# Patient Record
Sex: Male | Born: 1960 | Race: White | Hispanic: No | Marital: Married | Smoking: Former smoker
Health system: Southern US, Community
[De-identification: ages and names within clinical notes are randomized; demographics above are authoritative.]

## PROBLEM LIST (undated history)

## (undated) DIAGNOSIS — I10 Essential (primary) hypertension: Secondary | ICD-10-CM

## (undated) DIAGNOSIS — F419 Anxiety disorder, unspecified: Secondary | ICD-10-CM

## (undated) DIAGNOSIS — J189 Pneumonia, unspecified organism: Secondary | ICD-10-CM

## (undated) DIAGNOSIS — M1990 Unspecified osteoarthritis, unspecified site: Secondary | ICD-10-CM

## (undated) DIAGNOSIS — F329 Major depressive disorder, single episode, unspecified: Secondary | ICD-10-CM

## (undated) DIAGNOSIS — F32A Depression, unspecified: Secondary | ICD-10-CM

## (undated) HISTORY — PX: FOOT SURGERY: SHX648

## (undated) HISTORY — PX: TONSILLECTOMY: SUR1361

## (undated) HISTORY — PX: FINGER SURGERY: SHX640

---

## 2016-07-30 HISTORY — PX: ANTERIOR FUSION CERVICAL SPINE: SUR626

## 2017-03-07 ENCOUNTER — Other Ambulatory Visit (HOSPITAL_COMMUNITY): Payer: Self-pay | Admitting: Neurological Surgery

## 2017-03-07 DIAGNOSIS — M4727 Other spondylosis with radiculopathy, lumbosacral region: Secondary | ICD-10-CM

## 2017-03-14 ENCOUNTER — Ambulatory Visit (HOSPITAL_COMMUNITY)
Admission: RE | Admit: 2017-03-14 | Discharge: 2017-03-14 | Disposition: A | Discharge status: Home / Self Care | Payer: BLUE CROSS/BLUE SHIELD | Source: Ambulatory Visit | Attending: Neurological Surgery | Admitting: Neurological Surgery

## 2017-03-14 ENCOUNTER — Encounter (HOSPITAL_COMMUNITY): Payer: Self-pay

## 2017-03-14 ENCOUNTER — Ambulatory Visit (HOSPITAL_COMMUNITY): Payer: BLUE CROSS/BLUE SHIELD

## 2017-03-14 DIAGNOSIS — I2510 Atherosclerotic heart disease of native coronary artery without angina pectoris: Secondary | ICD-10-CM | POA: Insufficient documentation

## 2017-03-14 DIAGNOSIS — M48061 Spinal stenosis, lumbar region without neurogenic claudication: Secondary | ICD-10-CM | POA: Diagnosis not present

## 2017-03-14 DIAGNOSIS — M4727 Other spondylosis with radiculopathy, lumbosacral region: Secondary | ICD-10-CM | POA: Diagnosis present

## 2017-03-14 DIAGNOSIS — M5134 Other intervertebral disc degeneration, thoracic region: Secondary | ICD-10-CM | POA: Diagnosis not present

## 2017-03-21 ENCOUNTER — Other Ambulatory Visit: Payer: Self-pay | Admitting: Neurological Surgery

## 2017-04-24 ENCOUNTER — Encounter (HOSPITAL_COMMUNITY)
Admission: RE | Admit: 2017-04-24 | Discharge: 2017-04-24 | Disposition: A | Discharge status: Home / Self Care | Payer: BLUE CROSS/BLUE SHIELD | Source: Ambulatory Visit | Attending: Neurological Surgery | Admitting: Neurological Surgery

## 2017-04-24 ENCOUNTER — Encounter (HOSPITAL_COMMUNITY): Payer: Self-pay

## 2017-04-24 DIAGNOSIS — I10 Essential (primary) hypertension: Secondary | ICD-10-CM | POA: Diagnosis not present

## 2017-04-24 DIAGNOSIS — Z0183 Encounter for blood typing: Secondary | ICD-10-CM | POA: Diagnosis not present

## 2017-04-24 DIAGNOSIS — Z79899 Other long term (current) drug therapy: Secondary | ICD-10-CM | POA: Insufficient documentation

## 2017-04-24 DIAGNOSIS — Z01818 Encounter for other preprocedural examination: Secondary | ICD-10-CM | POA: Insufficient documentation

## 2017-04-24 DIAGNOSIS — Z87891 Personal history of nicotine dependence: Secondary | ICD-10-CM | POA: Insufficient documentation

## 2017-04-24 DIAGNOSIS — Z01812 Encounter for preprocedural laboratory examination: Secondary | ICD-10-CM | POA: Insufficient documentation

## 2017-04-24 HISTORY — DX: Unspecified osteoarthritis, unspecified site: M19.90

## 2017-04-24 HISTORY — DX: Major depressive disorder, single episode, unspecified: F32.9

## 2017-04-24 HISTORY — DX: Anxiety disorder, unspecified: F41.9

## 2017-04-24 HISTORY — DX: Depression, unspecified: F32.A

## 2017-04-24 HISTORY — DX: Essential (primary) hypertension: I10

## 2017-04-24 HISTORY — DX: Pneumonia, unspecified organism: J18.9

## 2017-04-24 LAB — CBC
HCT: 44.9 % (ref 39.0–52.0)
Hemoglobin: 15.9 g/dL (ref 13.0–17.0)
MCH: 31.7 pg (ref 26.0–34.0)
MCHC: 35.4 g/dL (ref 30.0–36.0)
MCV: 89.4 fL (ref 78.0–100.0)
PLATELETS: 224 10*3/uL (ref 150–400)
RBC: 5.02 MIL/uL (ref 4.22–5.81)
RDW: 12.5 % (ref 11.5–15.5)
WBC: 6 10*3/uL (ref 4.0–10.5)

## 2017-04-24 LAB — BASIC METABOLIC PANEL
ANION GAP: 9 (ref 5–15)
BUN: 8 mg/dL (ref 6–20)
CALCIUM: 9.6 mg/dL (ref 8.9–10.3)
CO2: 25 mmol/L (ref 22–32)
Chloride: 96 mmol/L — ABNORMAL LOW (ref 101–111)
Creatinine, Ser: 0.89 mg/dL (ref 0.61–1.24)
GFR calc Af Amer: >60 mL/min (ref >60)
Glucose, Bld: 94 mg/dL (ref 65–99)
POTASSIUM: 4.4 mmol/L (ref 3.5–5.1)
SODIUM: 130 mmol/L — AB (ref 135–145)

## 2017-04-24 LAB — TYPE AND SCREEN
ABO/RH(D): AB POS
ANTIBODY SCREEN: NEGATIVE

## 2017-04-24 LAB — SURGICAL PCR SCREEN
MRSA, PCR: NEGATIVE
Staphylococcus aureus: NEGATIVE

## 2017-04-24 LAB — ABO/RH: ABO/RH(D): AB POS

## 2017-04-24 NOTE — Pre-Procedure Instructions (Signed)
Jayten Gabbard  04/24/2017      [id number] [place], [place] - [address] [place] [address] Phone: [phone] Fax: [phone]    Your procedure is scheduled on Tuesday, 04/30/2017.  Report to [facility] Admitting at 0530 A.M.  Call this number if you have problems the morning of surgery:  [phone]   Remember:  Do not eat food or drink liquids after midnight.   Continue all other medications as directed by your physician except follow these medication instructions before surgery   Take these medicines the morning of surgery with A SIP OF WATER:  Amlodipine (Norvasc)  Methocarbamol (Robaxin)  Propanolol (Inderal)   7 days prior to surgery STOP taking any Aspirin, Aleve, Naproxen, Ibuprofen, Motrin, Advil, Goody's, BC's, all herbal medications, fish oil, and all vitamins    Do not wear jewelry, make-up or nail polish.  Do not wear lotions, powders, or perfumes, or deoderant.  Do not shave 48 hours prior to surgery.  Men may shave face and neck.  Do not bring valuables to the hospital.  [facility] is not responsible for any belongings or valuables.  Contacts, eyeglasses, dentures or bridgework may not be worn into surgery.  Leave your suitcase in the car.  After surgery it may be brought to your room.  For patients admitted to the hospital, discharge time will be determined by your treatment team.  Patients discharged the day of surgery will not be allowed to drive home.   Name and phone number of your driver:    Special instructions:   [facility]- Preparing For Surgery  Before surgery, you can play an important role. Because skin is not sterile, your skin needs to be as free of germs as possible. You can reduce the number of germs on your skin by washing with CHG (chlorahexidine gluconate) Soap before surgery.  CHG is an antiseptic cleaner which kills germs and bonds with the skin to continue killing germs even  after washing.  Please do not use if you have an allergy to CHG or antibacterial soaps. If your skin becomes reddened/irritated stop using the CHG.  Do not shave (including legs and underarms) for at least 48 hours prior to first CHG shower. It is OK to shave your face.  Please follow these instructions carefully.   1. Shower the NIGHT BEFORE SURGERY and the MORNING OF SURGERY with CHG.   2. If you chose to wash your hair, wash your hair first as usual with your normal shampoo.  3. After you shampoo, rinse your hair and body thoroughly to remove the shampoo.  4. Use CHG as you would any other liquid soap. You can apply CHG directly to the skin and wash gently with a scrungie or a clean washcloth.   5. Apply the CHG Soap to your body ONLY FROM THE NECK DOWN.  Do not use on open wounds or open sores. Avoid contact with your eyes, ears, mouth and genitals (private parts). Wash genitals (private parts) with your normal soap.  6. Wash thoroughly, paying special attention to the area where your surgery will be performed.  7. Thoroughly rinse your body with warm water from the neck down.  8. DO NOT shower/wash with your normal soap after using and rinsing off the CHG Soap.  9. Pat yourself dry with a CLEAN TOWEL.   10. Wear CLEAN PAJAMAS   11. Place CLEAN SHEETS on your bed the night of  your first shower and DO NOT SLEEP WITH PETS.    Day of Surgery: Shower as stated above. Do not apply any deodorants/lotions.  Please wear clean clothes to the hospital/surgery center.      Please read over the following fact sheets that you were given. Pain Booklet, Coughing and Deep Breathing, MRSA Information and Surgical Site Infection Prevention

## 2017-04-24 NOTE — Progress Notes (Signed)
PCP - Dr. Daryel Gerald Cardiologist - not followed by a cardiologist at this time but was seeing one, unsure who it was, for about 6 months, Patient states he had an echo and ekg done at that time  Chest x-ray - n/a EKG - 04/24/2017 Stress Test - patient denies ECHO - patient states it was late 2016-early 2017 Cardiac Cath - patient denies  Sleep Study - patient denies   Patient denies shortness of breath, fever, cough and chest pain at PAT appointment   Patient verbalized understanding of instructions that were given to them at the PAT appointment. Patient was also instructed that they will need to review over the PAT instructions again at home before surgery.  Requested Echo and last EKG tracing from [facility] in [place], [place]

## 2017-04-25 NOTE — Progress Notes (Signed)
Anesthesia Chart Review:  Pt is a 56 year old male scheduled for left L2-3 transpoas lumbar interbody fusion with right L3-4, L4-5 transpoas lumbar interbody fusion on 04/30/2017 and L2-S1 laminectomy for decompression with L2-pelvis fixation, fusion, Mazor on 05/01/2017 with Cherrie Distance, MD.   - PCP is Daryel Gerald, NP in [place], [place] - Saw cardiologist Nuala Alpha, MD in [place], [place] in 2017 for palpitations. Holter monitor, echo ordered.  PRN f/u recommended.   PMH includes: HTN. Former smoker. BMI 28  Medications include: amlodipine, lisinopril, propranolol  BP 130/79   Pulse 70   Temp 36.8 C   Resp 20   Ht 6' (1.829 m)   Wt 204 lb 4.8 oz (92.7 kg)   SpO2 100%   BMI 27.71 kg/m   Preoperative labs reviewed.    EKG 04/24/17: NSR  Echo 08/19/15 ([facility]): 1.LV cavity size normal. Wall thickness normal. Systolic function normal. Estimated EF 60-65%. Wall motion normal there were no regional wall motion abnormalities. 2. RV systolic function normal. 3. Pulmonary artery systolic pressure estimated to be 29 mmHg.  Holter monitor 08/18/15 ([facility]): 1. Sinus rhythm. Rare PACs and PVCs. 2. No prolonged atrial or ventricular arrhythmias. No sinus pauses.  If no changes, I anticipate pt can proceed with surgery as scheduled.   Rica Mast, FNP-BC [facility] Short Stay Surgical Center/Anesthesiology Phone: [phone] 04/25/2017 2:27 PM

## 2017-04-29 MED ORDER — CEFAZOLIN SODIUM-DEXTROSE 2-4 GM/100ML-% IV SOLN
2.0000 g | INTRAVENOUS | Status: DC
  Filled 2017-04-29: qty 100

## 2017-04-29 MED ORDER — CEFAZOLIN SODIUM-DEXTROSE 2-4 GM/100ML-% IV SOLN
2.0000 g | INTRAVENOUS | Status: AC
  Administered 2017-04-30: 2 g via INTRAVENOUS

## 2017-04-30 ENCOUNTER — Inpatient Hospital Stay (HOSPITAL_COMMUNITY)
Admission: RE | Admit: 2017-04-30 | Discharge: 2017-05-04 | DRG: 455 | Disposition: A | Discharge status: Home / Self Care | Payer: BLUE CROSS/BLUE SHIELD | Source: Ambulatory Visit | Attending: Neurological Surgery | Admitting: Neurological Surgery

## 2017-04-30 ENCOUNTER — Inpatient Hospital Stay (HOSPITAL_COMMUNITY): Payer: BLUE CROSS/BLUE SHIELD | Admitting: Certified Registered Nurse Anesthetist

## 2017-04-30 ENCOUNTER — Inpatient Hospital Stay (HOSPITAL_COMMUNITY): Payer: BLUE CROSS/BLUE SHIELD | Admitting: Emergency Medicine

## 2017-04-30 ENCOUNTER — Inpatient Hospital Stay (HOSPITAL_COMMUNITY)
Admission: RE | Disposition: A | Discharge status: Home / Self Care | Payer: Self-pay | Source: Ambulatory Visit | Attending: Neurological Surgery

## 2017-04-30 ENCOUNTER — Inpatient Hospital Stay (HOSPITAL_COMMUNITY): Payer: BLUE CROSS/BLUE SHIELD

## 2017-04-30 ENCOUNTER — Encounter (HOSPITAL_COMMUNITY): Payer: Self-pay

## 2017-04-30 DIAGNOSIS — Z419 Encounter for procedure for purposes other than remedying health state, unspecified: Secondary | ICD-10-CM

## 2017-04-30 DIAGNOSIS — Z981 Arthrodesis status: Secondary | ICD-10-CM | POA: Diagnosis not present

## 2017-04-30 DIAGNOSIS — I10 Essential (primary) hypertension: Secondary | ICD-10-CM | POA: Diagnosis present

## 2017-04-30 DIAGNOSIS — Z720 Tobacco use: Secondary | ICD-10-CM

## 2017-04-30 DIAGNOSIS — Z23 Encounter for immunization: Secondary | ICD-10-CM

## 2017-04-30 DIAGNOSIS — Z91018 Allergy to other foods: Secondary | ICD-10-CM | POA: Diagnosis not present

## 2017-04-30 DIAGNOSIS — Z79899 Other long term (current) drug therapy: Secondary | ICD-10-CM | POA: Diagnosis not present

## 2017-04-30 DIAGNOSIS — M4180 Other forms of scoliosis, site unspecified: Secondary | ICD-10-CM | POA: Diagnosis present

## 2017-04-30 DIAGNOSIS — M4727 Other spondylosis with radiculopathy, lumbosacral region: Secondary | ICD-10-CM | POA: Diagnosis present

## 2017-04-30 DIAGNOSIS — M5416 Radiculopathy, lumbar region: Secondary | ICD-10-CM | POA: Diagnosis present

## 2017-04-30 HISTORY — PX: ANTERIOR LAT LUMBAR FUSION: SHX1168

## 2017-04-30 SURGERY — ANTERIOR LATERAL LUMBAR FUSION 3 LEVELS
Anesthesia: General | Site: Spine Lumbar

## 2017-04-30 MED ORDER — FENTANYL CITRATE (PF) 100 MCG/2ML IJ SOLN
INTRAMUSCULAR | Status: DC | PRN
  Administered 2017-04-30: 50 ug via INTRAVENOUS
  Administered 2017-04-30 (×2): 100 ug via INTRAVENOUS
  Administered 2017-04-30: 50 ug via INTRAVENOUS

## 2017-04-30 MED ORDER — ACETAMINOPHEN 325 MG PO TABS
650.0000 mg | ORAL_TABLET | ORAL | Status: DC | PRN
  Administered 2017-05-02: 650 mg via ORAL
  Filled 2017-04-30 (×2): qty 2

## 2017-04-30 MED ORDER — THROMBIN 5000 UNITS EX SOLR
CUTANEOUS | Status: DC | PRN
  Administered 2017-04-30: 5 mL via TOPICAL

## 2017-04-30 MED ORDER — BACITRACIN 50000 UNITS IM SOLR
INTRAMUSCULAR | Status: DC | PRN
  Administered 2017-04-30: 500 mL

## 2017-04-30 MED ORDER — BISACODYL 10 MG RE SUPP: 10.0000 mg | Freq: Every day | RECTAL | Status: DC | PRN

## 2017-04-30 MED ORDER — LACTATED RINGERS IV SOLN
INTRAVENOUS | Status: DC | PRN
  Administered 2017-04-30: 07:00:00 via INTRAVENOUS

## 2017-04-30 MED ORDER — ONDANSETRON HCL 4 MG/2ML IJ SOLN: 4.0000 mg | Freq: Four times a day (QID) | INTRAMUSCULAR | Status: DC | PRN

## 2017-04-30 MED ORDER — HYDROMORPHONE HCL 1 MG/ML IJ SOLN
INTRAMUSCULAR | Status: AC
  Administered 2017-04-30: 0.5 mg via INTRAVENOUS
  Filled 2017-04-30: qty 1

## 2017-04-30 MED ORDER — BUPIVACAINE HCL (PF) 0.5 % IJ SOLN
INTRAMUSCULAR | Status: AC
  Filled 2017-04-30: qty 30

## 2017-04-30 MED ORDER — HYDROMORPHONE HCL 1 MG/ML IJ SOLN
1.0000 mg | INTRAMUSCULAR | Status: DC | PRN
  Administered 2017-04-30 (×2): 1 mg via INTRAVENOUS
  Filled 2017-04-30 (×2): qty 1

## 2017-04-30 MED ORDER — ACETAMINOPHEN 325 MG PO TABS: 325.0000 mg | ORAL_TABLET | ORAL | Status: DC | PRN

## 2017-04-30 MED ORDER — ONDANSETRON HCL 4 MG PO TABS: 4.0000 mg | ORAL_TABLET | Freq: Four times a day (QID) | ORAL | Status: DC | PRN

## 2017-04-30 MED ORDER — PHENYLEPHRINE HCL 10 MG/ML IJ SOLN
INTRAVENOUS | Status: DC | PRN
  Administered 2017-04-30: 25 ug/min via INTRAVENOUS

## 2017-04-30 MED ORDER — FLEET ENEMA 7-19 GM/118ML RE ENEM: 1.0000 | ENEMA | Freq: Once | RECTAL | Status: DC | PRN

## 2017-04-30 MED ORDER — SODIUM CHLORIDE 0.9% FLUSH
3.0000 mL | Freq: Two times a day (BID) | INTRAVENOUS | Status: DC
  Administered 2017-04-30 – 2017-05-03 (×7): 3 mL via INTRAVENOUS

## 2017-04-30 MED ORDER — ONDANSETRON HCL 4 MG/2ML IJ SOLN
INTRAMUSCULAR | Status: AC
  Filled 2017-04-30: qty 2

## 2017-04-30 MED ORDER — PHENOL 1.4 % MT LIQD: 1.0000 | OROMUCOSAL | Status: DC | PRN

## 2017-04-30 MED ORDER — GLYCOPYRROLATE 0.2 MG/ML IJ SOLN
INTRAMUSCULAR | Status: DC | PRN
  Administered 2017-04-30: 0.2 mg via INTRAVENOUS

## 2017-04-30 MED ORDER — OXYCODONE HCL 5 MG/5ML PO SOLN: 5.0000 mg | Freq: Once | ORAL | Status: DC | PRN

## 2017-04-30 MED ORDER — CHLORHEXIDINE GLUCONATE CLOTH 2 % EX PADS: 6.0000 | MEDICATED_PAD | Freq: Once | CUTANEOUS | Status: DC

## 2017-04-30 MED ORDER — ZOLPIDEM TARTRATE 5 MG PO TABS: 5.0000 mg | ORAL_TABLET | Freq: Every evening | ORAL | Status: DC | PRN

## 2017-04-30 MED ORDER — MIDAZOLAM HCL 5 MG/5ML IJ SOLN
INTRAMUSCULAR | Status: DC | PRN
  Administered 2017-04-30: 2 mg via INTRAVENOUS

## 2017-04-30 MED ORDER — 0.9 % SODIUM CHLORIDE (POUR BTL) OPTIME
TOPICAL | Status: DC | PRN
  Administered 2017-04-30: 1000 mL

## 2017-04-30 MED ORDER — DEXAMETHASONE SODIUM PHOSPHATE 10 MG/ML IJ SOLN
INTRAMUSCULAR | Status: AC
  Filled 2017-04-30: qty 1

## 2017-04-30 MED ORDER — BUPIVACAINE-EPINEPHRINE 0.5% -1:200000 IJ SOLN
INTRAMUSCULAR | Status: DC | PRN
  Administered 2017-04-30: 5 mL

## 2017-04-30 MED ORDER — METHOCARBAMOL 1000 MG/10ML IJ SOLN
500.0000 mg | Freq: Four times a day (QID) | INTRAMUSCULAR | Status: DC | PRN
  Filled 2017-04-30: qty 5

## 2017-04-30 MED ORDER — DEXAMETHASONE SODIUM PHOSPHATE 10 MG/ML IJ SOLN
INTRAMUSCULAR | Status: DC | PRN
  Administered 2017-04-30: 10 mg via INTRAVENOUS

## 2017-04-30 MED ORDER — LIDOCAINE 2% (20 MG/ML) 5 ML SYRINGE
INTRAMUSCULAR | Status: DC | PRN
  Administered 2017-04-30: 80 mg via INTRAVENOUS

## 2017-04-30 MED ORDER — SUCCINYLCHOLINE CHLORIDE 20 MG/ML IJ SOLN
INTRAMUSCULAR | Status: DC | PRN
  Administered 2017-04-30: 80 mg via INTRAVENOUS

## 2017-04-30 MED ORDER — LIDOCAINE-EPINEPHRINE 2 %-1:100000 IJ SOLN
INTRAMUSCULAR | Status: AC
  Filled 2017-04-30: qty 1

## 2017-04-30 MED ORDER — OXYCODONE HCL 5 MG PO TABS
5.0000 mg | ORAL_TABLET | ORAL | Status: DC | PRN
  Administered 2017-04-30 – 2017-05-04 (×13): 10 mg via ORAL
  Filled 2017-04-30 (×12): qty 2

## 2017-04-30 MED ORDER — SODIUM CHLORIDE 0.9 % IV SOLN
INTRAVENOUS | Status: DC
  Administered 2017-05-01: 23:00:00 via INTRAVENOUS

## 2017-04-30 MED ORDER — FENTANYL CITRATE (PF) 250 MCG/5ML IJ SOLN
INTRAMUSCULAR | Status: AC
  Filled 2017-04-30: qty 5

## 2017-04-30 MED ORDER — METHOCARBAMOL 1000 MG/10ML IJ SOLN
500.0000 mg | INTRAVENOUS | Status: AC
  Administered 2017-04-30: 500 mg via INTRAVENOUS
  Filled 2017-04-30: qty 5

## 2017-04-30 MED ORDER — CEFAZOLIN SODIUM-DEXTROSE 2-4 GM/100ML-% IV SOLN
2.0000 g | Freq: Three times a day (TID) | INTRAVENOUS | Status: AC
  Administered 2017-04-30 (×2): 2 g via INTRAVENOUS
  Filled 2017-04-30 (×2): qty 100

## 2017-04-30 MED ORDER — SODIUM CHLORIDE 0.9% FLUSH: 3.0000 mL | INTRAVENOUS | Status: DC | PRN

## 2017-04-30 MED ORDER — PROPOFOL 500 MG/50ML IV EMUL
INTRAVENOUS | Status: DC | PRN
  Administered 2017-04-30: 75 ug/kg/min via INTRAVENOUS

## 2017-04-30 MED ORDER — BUPIVACAINE LIPOSOME 1.3 % IJ SUSP
20.0000 mL | INTRAMUSCULAR | Status: DC
  Filled 2017-04-30: qty 20

## 2017-04-30 MED ORDER — METHOCARBAMOL 500 MG PO TABS
500.0000 mg | ORAL_TABLET | Freq: Four times a day (QID) | ORAL | Status: DC | PRN
  Administered 2017-04-30: 500 mg via ORAL
  Filled 2017-04-30: qty 1

## 2017-04-30 MED ORDER — LIDOCAINE 2% (20 MG/ML) 5 ML SYRINGE
INTRAMUSCULAR | Status: AC
  Filled 2017-04-30: qty 5

## 2017-04-30 MED ORDER — BUPIVACAINE HCL (PF) 0.5 % IJ SOLN
INTRAMUSCULAR | Status: DC | PRN
  Administered 2017-04-30: 5 mL
  Administered 2017-04-30: 3 mL

## 2017-04-30 MED ORDER — LIDOCAINE-EPINEPHRINE 2 %-1:100000 IJ SOLN
INTRAMUSCULAR | Status: DC | PRN
  Administered 2017-04-30: 5 mL

## 2017-04-30 MED ORDER — SENNA 8.6 MG PO TABS
1.0000 | ORAL_TABLET | Freq: Two times a day (BID) | ORAL | Status: DC
  Administered 2017-04-30 – 2017-05-04 (×7): 8.6 mg via ORAL
  Filled 2017-04-30 (×7): qty 1

## 2017-04-30 MED ORDER — DOCUSATE SODIUM 100 MG PO CAPS
100.0000 mg | ORAL_CAPSULE | Freq: Two times a day (BID) | ORAL | Status: DC
  Administered 2017-04-30 – 2017-05-04 (×7): 100 mg via ORAL
  Filled 2017-04-30 (×7): qty 1

## 2017-04-30 MED ORDER — PROPOFOL 1000 MG/100ML IV EMUL
INTRAVENOUS | Status: AC
  Filled 2017-04-30: qty 100

## 2017-04-30 MED ORDER — ACETAMINOPHEN 650 MG RE SUPP: 650.0000 mg | RECTAL | Status: DC | PRN

## 2017-04-30 MED ORDER — ACETAMINOPHEN 500 MG PO TABS
1000.0000 mg | ORAL_TABLET | Freq: Four times a day (QID) | ORAL | Status: AC
  Administered 2017-04-30 (×2): 1000 mg via ORAL
  Filled 2017-04-30 (×2): qty 2

## 2017-04-30 MED ORDER — OXYCODONE HCL 5 MG PO TABS
ORAL_TABLET | ORAL | Status: AC
  Filled 2017-04-30: qty 2

## 2017-04-30 MED ORDER — HYDROMORPHONE HCL 1 MG/ML IJ SOLN
0.2500 mg | INTRAMUSCULAR | Status: DC | PRN
  Administered 2017-04-30 (×4): 0.5 mg via INTRAVENOUS

## 2017-04-30 MED ORDER — BUPIVACAINE-EPINEPHRINE (PF) 0.5% -1:200000 IJ SOLN
INTRAMUSCULAR | Status: AC
  Filled 2017-04-30: qty 30

## 2017-04-30 MED ORDER — CHLORHEXIDINE GLUCONATE CLOTH 2 % EX PADS
6.0000 | MEDICATED_PAD | Freq: Once | CUTANEOUS | Status: AC
  Administered 2017-04-30: 6 via TOPICAL

## 2017-04-30 MED ORDER — LACTATED RINGERS IV SOLN
INTRAVENOUS | Status: DC | PRN
  Administered 2017-04-30 (×2): via INTRAVENOUS

## 2017-04-30 MED ORDER — MENTHOL 3 MG MT LOZG: 1.0000 | LOZENGE | OROMUCOSAL | Status: DC | PRN

## 2017-04-30 MED ORDER — CHLORHEXIDINE GLUCONATE CLOTH 2 % EX PADS
6.0000 | MEDICATED_PAD | Freq: Once | CUTANEOUS | Status: AC
  Administered 2017-05-01: 6 via TOPICAL

## 2017-04-30 MED ORDER — GABAPENTIN 300 MG PO CAPS
300.0000 mg | ORAL_CAPSULE | Freq: Three times a day (TID) | ORAL | Status: DC
  Administered 2017-04-30 – 2017-05-04 (×10): 300 mg via ORAL
  Filled 2017-04-30 (×9): qty 1

## 2017-04-30 MED ORDER — ONDANSETRON HCL 4 MG/2ML IJ SOLN
INTRAMUSCULAR | Status: DC | PRN
  Administered 2017-04-30: 4 mg via INTRAVENOUS

## 2017-04-30 MED ORDER — ACETAMINOPHEN 160 MG/5ML PO SOLN: 325.0000 mg | ORAL | Status: DC | PRN

## 2017-04-30 MED ORDER — ROCURONIUM BROMIDE 10 MG/ML (PF) SYRINGE
PREFILLED_SYRINGE | INTRAVENOUS | Status: AC
  Filled 2017-04-30: qty 5

## 2017-04-30 MED ORDER — THROMBIN 5000 UNITS EX SOLR
CUTANEOUS | Status: AC
  Filled 2017-04-30: qty 5000

## 2017-04-30 MED ORDER — TRANEXAMIC ACID 1000 MG/10ML IV SOLN
1000.0000 mg | INTRAVENOUS | Status: AC
  Administered 2017-05-01: 1000 mg via INTRAVENOUS
  Filled 2017-04-30 (×2): qty 10

## 2017-04-30 MED ORDER — OXYCODONE HCL 5 MG PO TABS: 5.0000 mg | ORAL_TABLET | Freq: Once | ORAL | Status: DC | PRN

## 2017-04-30 MED ORDER — PROPOFOL 10 MG/ML IV BOLUS
INTRAVENOUS | Status: DC | PRN
  Administered 2017-04-30 (×2): 50 mg via INTRAVENOUS
  Administered 2017-04-30: 40 mg via INTRAVENOUS
  Administered 2017-04-30: 160 mg via INTRAVENOUS

## 2017-04-30 MED ORDER — OXYCODONE HCL ER 10 MG PO T12A
10.0000 mg | EXTENDED_RELEASE_TABLET | Freq: Two times a day (BID) | ORAL | Status: DC
Start: 2017-04-30 — End: 2017-05-01
  Administered 2017-04-30: 10 mg via ORAL
  Filled 2017-04-30: qty 1

## 2017-04-30 MED ORDER — SODIUM CHLORIDE 0.9 % IJ SOLN
INTRAMUSCULAR | Status: AC
  Filled 2017-04-30: qty 20

## 2017-04-30 MED ORDER — MIDAZOLAM HCL 2 MG/2ML IJ SOLN
INTRAMUSCULAR | Status: AC
  Filled 2017-04-30: qty 2

## 2017-04-30 MED ORDER — SENNOSIDES-DOCUSATE SODIUM 8.6-50 MG PO TABS: 1.0000 | ORAL_TABLET | Freq: Every evening | ORAL | Status: DC | PRN

## 2017-04-30 MED ORDER — PROPOFOL 10 MG/ML IV BOLUS
INTRAVENOUS | Status: AC
  Filled 2017-04-30: qty 40

## 2017-04-30 MED FILL — Heparin Sodium (Porcine) Inj 1000 Unit/ML: INTRAMUSCULAR | Qty: 30 | Status: AC

## 2017-04-30 MED FILL — Sodium Chloride IV Soln 0.9%: INTRAVENOUS | Qty: 1000 | Status: AC

## 2017-04-30 SURGICAL SUPPLY — 72 items
BATTALION LLIF ITRADISCAL SHIM (MISCELLANEOUS) ×2
BLADE 10 SAFETY STRL DISP (BLADE) ×2 IMPLANT
BLADE CLIPPER SURG (BLADE) IMPLANT
CABLE BATTALION LLIF LIGHT (MISCELLANEOUS) ×2 IMPLANT
CAGE BATTALION 8X22X55 6D LLIF (Cage) ×4 IMPLANT
CAGE BATTALION 8X22X60 6D LLIF (Cage) ×2 IMPLANT
CHLORAPREP W/TINT 26ML (MISCELLANEOUS) ×4 IMPLANT
COUNTER NEEDLE 20 DBL MAG RED (NEEDLE) ×2 IMPLANT
DECANTER SPIKE VIAL GLASS SM (MISCELLANEOUS) IMPLANT
DERMABOND ADVANCED (GAUZE/BANDAGES/DRESSINGS) ×2
DERMABOND ADVANCED .7 DNX12 (GAUZE/BANDAGES/DRESSINGS) ×2 IMPLANT
DILATOR INSULATED ML04405 (MISCELLANEOUS) IMPLANT
DILATOR INSULATED XL 8X13 (MISCELLANEOUS) ×2 IMPLANT
DILATOR INSULT XL 8X13X18 (DRILL) IMPLANT
DISSECTOR BLUNT TIP ENDO 5MM (MISCELLANEOUS) IMPLANT
DRAPE C-ARM 42X72 X-RAY (DRAPES) ×2 IMPLANT
DRAPE C-ARMOR (DRAPES) ×2 IMPLANT
DRAPE LAPAROTOMY 100X72X124 (DRAPES) ×2 IMPLANT
DRAPE POUCH INSTRU U-SHP 10X18 (DRAPES) ×4 IMPLANT
DRAPE UNIVERSAL PACK (DRAPES) IMPLANT
ELECT COATED BLADE 2.86 ST (ELECTRODE) ×2 IMPLANT
ELECT REM PT RETURN 9FT ADLT (ELECTROSURGICAL) ×4
ELECTRODE REM PT RTRN 9FT ADLT (ELECTROSURGICAL) ×2 IMPLANT
FEE INTRAOP MONITOR IMPULS NCS (MISCELLANEOUS) ×1 IMPLANT
GAUZE SPONGE 4X4 16PLY XRAY LF (GAUZE/BANDAGES/DRESSINGS) IMPLANT
GLOVE BIO SURGEON STRL SZ7 (GLOVE) IMPLANT
GLOVE BIO SURGEON STRL SZ7.5 (GLOVE) ×6 IMPLANT
GLOVE BIOGEL PI IND STRL 7.0 (GLOVE) ×6 IMPLANT
GLOVE BIOGEL PI IND STRL 7.5 (GLOVE) ×3 IMPLANT
GLOVE BIOGEL PI INDICATOR 7.0 (GLOVE) ×6
GLOVE BIOGEL PI INDICATOR 7.5 (GLOVE) ×3
GLOVE SS BIOGEL STRL SZ 7.5 (GLOVE) ×1 IMPLANT
GLOVE SUPERSENSE BIOGEL SZ 7.5 (GLOVE) ×1
GLOVE SURG SS PI 7.0 STRL IVOR (GLOVE) ×4 IMPLANT
GLOVE SURG SS PI 7.5 STRL IVOR (GLOVE) ×6 IMPLANT
GOWN STRL REUS W/ TWL LRG LVL3 (GOWN DISPOSABLE) ×3 IMPLANT
GOWN STRL REUS W/ TWL XL LVL3 (GOWN DISPOSABLE) IMPLANT
GOWN STRL REUS W/TWL 2XL LVL3 (GOWN DISPOSABLE) IMPLANT
GOWN STRL REUS W/TWL LRG LVL3 (GOWN DISPOSABLE) ×3
GOWN STRL REUS W/TWL XL LVL3 (GOWN DISPOSABLE)
GUIDEWIRE 320MM BLUNT TIP (WIRE) ×4 IMPLANT
HEMOSTAT POWDER KIT SURGIFOAM (HEMOSTASIS) ×2 IMPLANT
INTRAOP MONITOR FEE IMPULS NCS (MISCELLANEOUS) ×1
INTRAOP MONITOR FEE IMPULSE (MISCELLANEOUS) ×1
KIT BASIN OR (CUSTOM PROCEDURE TRAY) ×2 IMPLANT
KIT INFUSE SMALL (Orthopedic Implant) ×2 IMPLANT
KIT ROOM TURNOVER OR (KITS) ×2 IMPLANT
LEAD WIRE MULTI STAGE DISP (NEUROSURGERY SUPPLIES) ×4 IMPLANT
NEEDLE HYPO 21X1.5 SAFETY (NEEDLE) ×4 IMPLANT
NEEDLE HYPO 25X1 1.5 SAFETY (NEEDLE) IMPLANT
NS IRRIG 1000ML POUR BTL (IV SOLUTION) ×2 IMPLANT
PACK LAMINECTOMY NEURO (CUSTOM PROCEDURE TRAY) ×2 IMPLANT
PROBE BALL TIP STIM 200MM 2.3M (NEUROSURGERY SUPPLIES) ×4 IMPLANT
PUTTY DBM GRAFTON 5CC (Putty) ×4 IMPLANT
SHIM ITRADISCAL BATTALION LLIF (MISCELLANEOUS) ×1 IMPLANT
SPONGE LAP 4X18 X RAY DECT (DISPOSABLE) IMPLANT
STAPLER VISISTAT 35W (STAPLE) ×2 IMPLANT
SUT STRATAFIX MNCRL+ 3-0 PS-2 (SUTURE) ×2
SUT STRATAFIX MONOCRYL 3-0 (SUTURE) ×2
SUT VIC AB 0 CT1 18XCR BRD8 (SUTURE) ×2 IMPLANT
SUT VIC AB 0 CT1 8-18 (SUTURE) ×2
SUT VIC AB 1 CT1 18XBRD ANBCTR (SUTURE) IMPLANT
SUT VIC AB 1 CT1 8-18 (SUTURE)
SUT VIC AB 2-0 CT1 18 (SUTURE) ×4 IMPLANT
SUT VIC AB 3-0 SH 8-18 (SUTURE) IMPLANT
SUTURE STRATFX MNCRL+ 3-0 PS-2 (SUTURE) ×2 IMPLANT
SYR 30ML LL (SYRINGE) ×2 IMPLANT
TOWEL GREEN STERILE (TOWEL DISPOSABLE) ×2 IMPLANT
TOWEL GREEN STERILE FF (TOWEL DISPOSABLE) ×2 IMPLANT
TRAY FOLEY W/METER SILVER 16FR (SET/KITS/TRAYS/PACK) ×2 IMPLANT
TUBE CONNECTING 20X1/4 (TUBING) ×2 IMPLANT
WATER STERILE IRR 1000ML POUR (IV SOLUTION) ×2 IMPLANT

## 2017-04-30 NOTE — Transfer of Care (Signed)
Immediate Anesthesia Transfer of Care Note  Patient: Seth Colon  Procedure(s) Performed: Stage 1: Left Lumbar two -three Transpsoas lumbar interbody fusion with Right Lumbar three-four, Lumbar four -five Transpsoas lumbar interbody fusion (N/A Spine Lumbar)  Patient Location: PACU  Anesthesia Type:General  Level of Consciousness: oriented, drowsy and patient cooperative  Airway & Oxygen Therapy: Patient Spontanous Breathing and Patient connected to nasal cannula oxygen  Post-op Assessment: Report given to RN and Post -op Vital signs reviewed and stable  Post vital signs: Reviewed and stable  Last Vitals:  Vitals:   04/30/17 1133 04/30/17 1134  BP: 117/76   Pulse: 69   Resp: (!) 21   Temp:  36.5 C  SpO2: 100%     Last Pain:  Vitals:   04/30/17 1134  TempSrc:   PainSc: Asleep         Complications: No apparent anesthesia complications

## 2017-04-30 NOTE — H&P (Signed)
CC:  No chief complaint on file. Back and leg pain  HPI: Seth Colon is a 56 y.o. male with adult degenerative scoliosis and lumbar radiculopathy.  He complains of back and leg pain.  He presents for elective lumbar fusion.  He denies new neurological symptoms.  PMH: Past Medical History:  Diagnosis Date  . Anxiety   . Arthritis   . Depression   . Hypertension   . Pneumonia     PSH: Past Surgical History:  Procedure Laterality Date  . ANTERIOR FUSION CERVICAL SPINE  07/2016  . FINGER SURGERY     "as child"   . FOOT SURGERY     for infection  . TONSILLECTOMY      SH: Social History  Substance Use Topics  . Smoking status: Former Smoker    Quit date: 01/2017  . Smokeless tobacco: Current User    Types: Chew  . Alcohol use 10.8 oz/week    18 Cans of beer per week    MEDS: Prior to Admission medications   Medication Sig Start Date End Date Taking? Authorizing Provider  amLODipine (NORVASC) 10 MG tablet Take 10 mg by mouth daily.   Yes [provider]  lisinopril (PRINIVIL,ZESTRIL) 40 MG tablet Take 40 mg by mouth daily.   Yes [provider]  methocarbamol (ROBAXIN) 750 MG tablet Take 750 mg by mouth 2 (two) times daily.   Yes [provider]  propranolol (INDERAL) 20 MG tablet Take 20 mg by mouth daily.   Yes [provider]    ALLERGY: Allergies  Allergen Reactions  . Other Swelling    MELONS > FACIAL SWELLING    ROS: ROS  NEUROLOGIC EXAM: Awake, alert, oriented Memory and concentration grossly intact Speech fluent, appropriate CN grossly intact Motor exam: Upper Extremities Deltoid Bicep Tricep Grip  Right 5/5 5/5 5/5 5/5  Left 5/5 5/5 5/5 5/5   Lower Extremity IP Quad PF DF EHL  Right 5/5 5/5 5/5 5/5 5/5  Left 5/5 5/5 5/5 5/5 5/5   Sensation grossly intact to LT  IMAGING: No new imaging  IMPRESSION: - 56 y.o. male with adult degenerative scoliosis.  He is neurologically intact.  PLAN: - Today: L2-5  anterior lumbar interbody fusion via transpsoas approach. - Tomorrow: L2-pelvis fixation and fusion with decompression, L5-S1 PLIF.

## 2017-04-30 NOTE — Anesthesia Preprocedure Evaluation (Signed)
Anesthesia Evaluation  Patient identified by MRN, date of birth, ID band Patient awake    Reviewed: Allergy & Precautions, NPO status , Patient's Chart, lab work & pertinent test results, reviewed documented beta blocker date and time   History of Anesthesia Complications Negative for: history of anesthetic complications  Airway Mallampati: II  TM Distance: >3 FB Neck ROM: Full    Dental  (+) Teeth Intact,    Pulmonary neg shortness of breath, neg COPD, neg recent URI, former smoker,    breath sounds clear to auscultation- rhonchi       Cardiovascular hypertension, Pt. on medications and Pt. on home beta blockers + dysrhythmias  Rhythm:Regular     Neuro/Psych PSYCHIATRIC DISORDERS Anxiety Depression  Neuromuscular disease    GI/Hepatic negative GI ROS, Neg liver ROS,   Endo/Other  negative endocrine ROS  Renal/GU negative Renal ROS     Musculoskeletal  (+) Arthritis ,   Abdominal   Peds  Hematology negative hematology ROS (+)   Anesthesia Other Findings Pt is a 56 year old male scheduled for left L2-3 transpoas lumbar interbody fusion with right L3-4, L4-5 transpoas lumbar interbody fusion on 04/30/2017 and L2-S1 laminectomy for decompression with L2-pelvis fixation, fusion, Mazor on 05/01/2017 with Cherrie Distance, MD.   - PCP is Daryel Gerald, NP in [place], [place] - Saw cardiologist Nuala Alpha, MD in [place], [place] in 2017 for palpitations. Holter monitor, echo ordered.  PRN f/u recommended.   PMH includes: HTN. Former smoker. BMI 28  Medications include: amlodipine, lisinopril, propranolol  BP 130/79   Pulse 70   Temp 36.8 C   Resp 20   Ht 6' (1.829 m)   Wt 204 lb 4.8 oz (92.7 kg)   SpO2 100%   BMI 27.71 kg/m   Preoperative labs reviewed.    EKG 04/24/17: NSR  Echo 08/19/15 ([facility]): 1.LV cavity size normal. Wall thickness normal. Systolic function normal. Estimated EF  60-65%. Wall motion normal there were no regional wall motion abnormalities. 2. RV systolic function normal. 3. Pulmonary artery systolic pressure estimated to be 29 mmHg.  Holter monitor 08/18/15 ([facility]): 1. Sinus rhythm. Rare PACs and PVCs. 2. No prolonged atrial or ventricular arrhythmias. No sinus pauses.  Reproductive/Obstetrics                             Anesthesia Physical Anesthesia Plan  ASA: II  Anesthesia Plan: General   Post-op Pain Management:    Induction: Intravenous  PONV Risk Score and Plan: 2 and Ondansetron and Dexamethasone  Airway Management Planned: Oral ETT  Additional Equipment: None  Intra-op Plan:   Post-operative Plan: Extubation in OR  Informed Consent: I have reviewed the patients History and Physical, chart, labs and discussed the procedure including the risks, benefits and alternatives for the proposed anesthesia with the patient or authorized representative who has indicated his/her understanding and acceptance.   Dental advisory given  Plan Discussed with: CRNA and Surgeon  Anesthesia Plan Comments:         Anesthesia Quick Evaluation

## 2017-04-30 NOTE — Op Note (Addendum)
04/30/2017  11:12 AM  PATIENT:  Seth Colon  56 y.o. male  PRE-OPERATIVE DIAGNOSIS:  Adult degenerative scoliosis; lumbosacral spondylosis with radiculopathy  POST-OPERATIVE DIAGNOSIS:  Same  PROCEDURE:  L2-5 anterior lumbar interbody fusion via transpsoas approach; use of BMP  SURGEON:  Hulan Saas, MD  ASSISTANTS: Cindra Presume, PA-C  ANESTHESIA:   General  DRAINS: None   SPECIMEN:  None  INDICATION FOR PROCEDURE: 56 year old male with adult degenerative scoliosis and lumbar radiculopathy.  I recommended the above procedure.  Patient understood the risks, benefits, and alternatives and potential outcomes and wished to proceed.  PROCEDURE DETAILS: The patient was brought to the operating room.  After smooth induction of general endotracheal anesthesia the patient was placed in the lateral position with the right side up and secured with tape.  Localizing views were taken with fluoroscopy.  The operative site was then prepped and draped in the usual sterile fashion.  The planned incisions were infiltrated with lidocaine with epinephrine.   The skin was sharply incised and soft tissue to the level of the oblique abdominal muscles was carried out with monopolar cautery.  I then bluntly dissected through the oblique muscles with care taken to preserve any nerves.  I encountered encountered transversalis fascia which was bluntly entered and this opened into an easily dissected fat plane consistent with the retroperitoneum.  I inserted the dilator and approached the posterior third of the L4-5 disc space.  I confirmed that I was not in contact with any nerves of the lumbar plexus. A K-wire was passed into the disc space.  The tract was sequentially dilated and then the retractor was introduced.  I incised the disc space and then passed a Cobb elevator along each endplate and penetrated the annulus on the contralateral side.  I then used box cutters, a paddle, and a rasp to complete  the discectomy.  A trial spacer was used to determine appropriate graft size and then a PEEK lordotic interbody graft packed with BMP and grafton was inserted.  The retractor was removed after hemostasis was confirmed.   The retractor was then repositioned to L3-4.  Discectomy was again performed.  A second interbody graft packed with BMP and grafton was inserted.  Hemostasis was again confirmed and the retractor was removed.     The incision was closed in layers with interrupted vicryl sutures.  The skin was closed with running monocryl stratafix suture and dermabond.  The patient was then turned left side up and secured.  He was prepped and draped in the usual sterile fashion.  The oblique abdominal muscles were dissected again.  Dilation was performed with neuromonitoring to approach the L2-3 disc space.  This was confirmed with xrays as indicated above.  The retractor was placed and secured to the table.  A shim was placed to dock to the disc space.  Discectomy was performed as described above.  The endplates were prepared. A lordotic PEEK interbody cage with grafton and BMP was inserted.   Hemostasis was confirmed.  I irrigated with bacitracin saline.  The retractor was removed and the psoas muscle was visualized during removal.  The incision was closed in layers with interrupted vicryl sutures.  The skin was closed with running monocryl stratafix suture and dermabond.  The patient was transferred to the stretcher and awoke without incident.  PATIENT DISPOSITION:  PACU - hemodynamically stable.   Delay start of Pharmacological VTE agent (>24hrs) due to surgical blood loss or risk of bleeding:  yes

## 2017-04-30 NOTE — Evaluation (Signed)
Physical Therapy Evaluation Patient Details Name: Seth Colon MRN: [id number] DOB: October 25, 1960 Today's Date: 04/30/2017   History of Present Illness  Pt is a 56 y.o. male with adult degenerative scoliosis and lumbar radiculopathy, now s/p L2-4 ALIF on 04/30/17. PHM including anxiety, arthritis, depression, HTN, and cervical fusion (Jan 2018).    Clinical Impression  Pt presents with pain and an overall decrease in functional mobility secondary to above. PTA, pt indep and lives at home with wife. Educ on precautions, positioning, and importance of mobility. Today, pt able to transfer and amb with RW and min guard for balance, requiring 2x standing rest breaks; further mobility limited secondary to increased fatigue. Pt would benefit from continued acute PT services to maximize functional mobility and independence. At this time, feel pt would benefit from follow-up with HHPT, but this may change to no PT follow-up pending pt progression. Will continue to follow acutely.    Follow Up Recommendations Home health PT;Supervision for mobility/OOB    Equipment Recommendations  Rolling walker with 5" wheels    Recommendations for Other Services       Precautions / Restrictions Precautions Precautions: Back Precaution Booklet Issued: Yes (comment) Precaution Comments: Reviewed back precautions and handout. Pt able to recall 2/3 precautions from prior OT session Required Braces or Orthoses: Other Brace/Splint (No MD order) Other Brace/Splint: "No brace needed" Restrictions Weight Bearing Restrictions: No      Mobility  Bed Mobility Overal bed mobility: Needs Assistance Bed Mobility: Rolling;Sidelying to Sit Rolling: Min guard Sidelying to sit: Min guard     Sit to sidelying: Min assist General bed mobility comments: Min guard with cues for log-rolling to maintain back precautions  Transfers Overall transfer level: Needs assistance Equipment used: 1 person hand held assist;Rolling  walker (2 wheeled) Transfers: Sit to/from Stand Sit to Stand: Min guard;Min assist         General transfer comment: Initial stand with minA and pt requiring HHA to steady balance, reporting "uneasiness" and dizziness that subsided with seated rest. Stood again with RW and min guard for balance with much improved stability; cues for hand placement and technique with RW  Ambulation/Gait Ambulation/Gait assistance: Min guard Ambulation Distance (Feet): 200 Feet Assistive device: Rolling walker (2 wheeled) Gait Pattern/deviations: Step-through pattern;Decreased stride length;Antalgic;Trunk flexed Gait velocity: Decreased Gait velocity interpretation: <1.8 ft/sec, indicative of risk for recurrent falls General Gait Details: Slow, antalgic gait with RW and min guard for balance; 3x standing rest break secondary to increased "shakiness" and fatigue with amb. Educ on activity pacing and deep breathing technique  Stairs            Wheelchair Mobility    Modified Rankin (Stroke Patients Only)       Balance Overall balance assessment: Needs assistance Sitting-balance support: No upper extremity supported;Feet supported Sitting balance-Leahy Scale: Good     Standing balance support: During functional activity;Single extremity supported Standing balance-Leahy Scale: Poor Standing balance comment: Rliant on UE support for balance                             Pertinent Vitals/Pain Pain Assessment: Faces Faces Pain Scale: Hurts little more Pain Location: Back Pain Descriptors / Indicators: Discomfort;Sore;Tingling Pain Intervention(s): Limited activity within patient's tolerance;Monitored during session    Home Living Family/patient expects to be discharged to:: Private residence Living Arrangements: Spouse/significant other Available Help at Discharge: Family;Available 24 hours/day (sister will be there 1st week) Type of Home: House  Home Access: Stairs to  enter Entrance Stairs-Rails: Right Entrance Stairs-Number of Steps: 4 Home Layout: One level Home Equipment: None      Prior Function Level of Independence: Independent         Comments: Works as Merchandiser, 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"     Hand Dominance   Dominant Hand: Right    Extremity/Trunk Assessment   Upper Extremity Assessment Upper Extremity Assessment: Overall WFL for tasks assessed    Lower Extremity Assessment Lower Extremity Assessment: LLE deficits/detail;Overall WFL for tasks assessed LLE Sensation: decreased light touch    Cervical / Trunk Assessment Cervical / Trunk Assessment: Other exceptions Cervical / Trunk Exceptions: s/p L2-5 anterior lumbar interbody fusion   Communication   Communication: No difficulties  Cognition Arousal/Alertness: Awake/alert Behavior During Therapy: WFL for tasks assessed/performed Overall Cognitive Status: Within Functional Limits for tasks assessed                                        General Comments General comments (skin integrity, edema, etc.): Wife and sister present throughout session    Exercises     Assessment/Plan    PT Assessment Patient needs continued PT services  PT Problem List Decreased strength;Decreased range of motion;Decreased activity tolerance;Decreased balance;Decreased mobility;Decreased knowledge of use of DME       PT Treatment Interventions DME instruction;Gait training;Stair training;Functional mobility training;Therapeutic activities;Therapeutic exercise;Balance training;Patient/family education    PT Goals (Current goals can be found in the Care Plan section)  Acute Rehab PT Goals Patient Stated Goal: Return home PT Goal Formulation: With patient Time For Goal Achievement: 04/30/17 Potential to Achieve Goals: Good    Frequency 7X/week   Barriers to discharge        Co-evaluation               AM-PAC PT "6 Clicks" Daily Activity  Outcome  Measure Difficulty turning over in bed (including adjusting bedclothes, sheets and blankets)?: A Little Difficulty moving from lying on back to sitting on the side of the bed? : A Little Difficulty sitting down on and standing up from a chair with arms (e.g., wheelchair, bedside commode, etc,.)?: A Little Help needed moving to and from a bed to chair (including a wheelchair)?: A Little Help needed walking in hospital room?: A Little Help needed climbing 3-5 steps with a railing? : A Little 6 Click Score: 18    End of Session Equipment Utilized During Treatment: Gait belt Activity Tolerance: Patient tolerated treatment well;Patient limited by fatigue Patient left: in chair;with call bell/phone within reach;with family/visitor present   PT Visit Diagnosis: Unsteadiness on feet (R26.81);Other abnormalities of gait and mobility (R26.89)    Time: 1610-9604 PT Time Calculation (min) (ACUTE ONLY): 24 min   Charges:   PT Evaluation $PT Eval Moderate Complexity: 1 Mod PT Treatments $Gait Training: 8-22 mins   PT G Codes:       Ina Homes, PT, DPT Acute Rehab Services  Pager: [phone]  Malachy Chamber 04/30/2017, 5:33 PM

## 2017-04-30 NOTE — Anesthesia Procedure Notes (Signed)
Procedure Name: Intubation Date/Time: 04/30/2017 7:45 AM Performed by: Annabelle Harman A Pre-anesthesia Checklist: Patient identified, Emergency Drugs available, Suction available and Patient being monitored Patient Re-evaluated:Patient Re-evaluated prior to induction Oxygen Delivery Method: Circle system utilized Preoxygenation: Pre-oxygenation with 100% oxygen Induction Type: IV induction Ventilation: Mask ventilation without difficulty and Oral airway inserted - appropriate to patient size Laryngoscope Size: Hyacinth Meeker and 2 Grade View: Grade II Tube type: Oral Tube size: 8.0 mm Number of attempts: 1 Airway Equipment and Method: Stylet Placement Confirmation: ETT inserted through vocal cords under direct vision,  positive ETCO2 and breath sounds checked- equal and bilateral Secured at: 23 cm Tube secured with: Tape Dental Injury: Teeth and Oropharynx as per pre-operative assessment

## 2017-04-30 NOTE — Evaluation (Signed)
Occupational Therapy Evaluation Patient Details Name: Seth Colon MRN: [id number] DOB: Mar 16, 1961 Today's Date: 04/30/2017    History of Present Illness 56 y.o. male with adult degenerative scoliosis and lumbar radiculopathy. PHM including anxiety, arthritis, depression, HTN, and cervical fusion (Jan 2018).   Clinical Impression   PTA, pt was living with his wife and was independent. Pt currently requiring per up for UB ADLs, Min Guard for functional mobility, and Max A for LB ADLs to adhere to back precautions. Pt would benefit from further acute OT to address LB ADLs with AE and facilitate safe dc. Recommend dc home with initial 24 hour assist/supervision once medically stable per physician.     Follow Up Recommendations  No OT follow up;Supervision/Assistance - 24 hour    Equipment Recommendations  3 in 1 bedside commode    Recommendations for Other Services PT consult     Precautions / Restrictions Precautions Precautions: Back Precaution Booklet Issued: Yes (comment) Precaution Comments: Reviewed back precautions and handout.  Required Braces or Orthoses: Other Brace/Splint (No MD order) Restrictions Weight Bearing Restrictions: No      Mobility Bed Mobility Overal bed mobility: Needs Assistance Bed Mobility: Rolling;Sidelying to Sit;Sit to Sidelying Rolling: Min guard Sidelying to sit: Min guard     Sit to sidelying: Min assist General bed mobility comments: Min A to bring BLEs into bed  Transfers Overall transfer level: Needs assistance Equipment used: 1 person hand held assist Transfers: Sit to/from Stand Sit to Stand: Min assist;From elevated surface         General transfer comment: Min A to steady for sit<>Stand    Balance Overall balance assessment: Needs assistance Sitting-balance support: No upper extremity supported;Feet supported Sitting balance-Leahy Scale: Good     Standing balance support: During functional activity;Single extremity  supported Standing balance-Leahy Scale: Fair Standing balance comment: Benefits from single UE support. Min Guard for safety. Feel he will progress to no UE support with time                           ADL either performed or assessed with clinical judgement   ADL Overall ADL's : Needs assistance/impaired Eating/Feeding: Set up;Sitting   Grooming: Wash/dry face;Min guard;Standing   Upper Body Bathing: Set up;Supervision/ safety;Sitting   Lower Body Bathing: Maximal assistance;Sit to/from stand Lower Body Bathing Details (indicate cue type and reason): Max A due to back precautions. Needs further education on AE to maximize independence  Upper Body Dressing : Set up;Supervision/safety;Sitting   Lower Body Dressing: Maximal assistance;Sit to/from stand Lower Body Dressing Details (indicate cue type and reason): Max A due to back precautions. Needs further education on AE to maximize independence  Toilet Transfer: Minimal assistance;Ambulation (Simulated at EOB)           Functional mobility during ADLs: Min guard;Cueing for safety (hand on IV pole) General ADL Comments: Pt presenting with decreased fucntional performance due to pain. Pt will need further education on AE to increase independence with LB ADLs.      Vision         Perception     Praxis      Pertinent Vitals/Pain Pain Assessment: Faces Faces Pain Scale: Hurts even more Pain Location: Back Pain Descriptors / Indicators: Constant;Discomfort;Grimacing;Guarding Pain Intervention(s): Monitored during session;Limited activity within patient's tolerance;Repositioned;Patient requesting pain meds-RN notified     Hand Dominance Right   Extremity/Trunk Assessment Upper Extremity Assessment Upper Extremity Assessment: Overall WFL for tasks assessed  Lower Extremity Assessment Lower Extremity Assessment: Defer to PT evaluation   Cervical / Trunk Assessment Cervical / Trunk Assessment: Other  exceptions Cervical / Trunk Exceptions: s/p L2-5 anterior lumbar interbody fusion    Communication Communication Communication: No difficulties   Cognition Arousal/Alertness: Awake/alert Behavior During Therapy: WFL for tasks assessed/performed Overall Cognitive Status: Within Functional Limits for tasks assessed                                     General Comments  Wife and sister present throughout session    Exercises     Shoulder Instructions      Home Living Family/patient expects to be discharged to:: Private residence Living Arrangements: Spouse/significant other Available Help at Discharge: Family;Available 24 hours/day (Sister will be there first week) Type of Home: House Home Access: Stairs to enter [employer] of Steps: 4 Entrance Stairs-Rails: Right Home Layout: One level     Bathroom Shower/Tub: Chief Strategy Officer: Standard ("low")     Home Equipment: None          Prior Functioning/Environment Level of Independence: Independent                 OT Problem List: Decreased range of motion;Decreased activity tolerance;Impaired balance (sitting and/or standing);Decreased knowledge of use of DME or AE;Decreased knowledge of precautions;Pain      OT Treatment/Interventions: Self-care/ADL training;Therapeutic exercise;Energy conservation;DME and/or AE instruction;Therapeutic activities;Patient/family education    OT Goals(Current goals can be found in the care plan section) Acute Rehab OT Goals Patient Stated Goal: Stop pain OT Goal Formulation: With patient/family Time For Goal Achievement: 05/14/17 Potential to Achieve Goals: Good ADL Goals Pt Will Perform Lower Body Dressing: with supervision;with set-up;with adaptive equipment;sit to/from stand Pt Will Transfer to Toilet: with supervision;with set-up;bedside commode;ambulating Pt Will Perform Toileting - Clothing Manipulation and hygiene: with  supervision;with set-up;sit to/from stand Pt Will Perform Tub/Shower Transfer: Shower transfer;3 in 1;ambulating;with set-up;with supervision  OT Frequency: Min 2X/week   Barriers to D/C:            Co-evaluation              AM-PAC PT "6 Clicks" Daily Activity     Outcome Measure Help from another person eating meals?: None Help from another person taking care of personal grooming?: None Help from another person toileting, which includes using toliet, bedpan, or urinal?: A Little Help from another person bathing (including washing, rinsing, drying)?: A Little Help from another person to put on and taking off regular upper body clothing?: None Help from another person to put on and taking off regular lower body clothing?: A Little 6 Click Score: 21   End of Session Nurse Communication: Mobility status;Precautions  Activity Tolerance: Patient tolerated treatment well;Patient limited by pain Patient left: in bed;with call bell/phone within reach;with family/visitor present  OT Visit Diagnosis: Unsteadiness on feet (R26.81);Other abnormalities of gait and mobility (R26.89);Muscle weakness (generalized) (M62.81);Pain Pain - Right/Left:  (Back) Pain - part of body:  (Back)                Time: 8295-6213 OT Time Calculation (min): 20 min Charges:  OT General Charges $OT Visit: 1 Visit OT Evaluation $OT Eval Moderate Complexity: 1 Mod G-Codes:     Seth Colon MSOT, OTR/L Acute Rehab Pager: [phone] Office: [phone]  Theodoro Grist Jaison Petraglia 04/30/2017, 4:35 PM

## 2017-05-01 ENCOUNTER — Inpatient Hospital Stay (HOSPITAL_COMMUNITY)
Admission: RE | Disposition: A | Discharge status: Home / Self Care | Payer: Self-pay | Source: Ambulatory Visit | Attending: Neurological Surgery

## 2017-05-01 ENCOUNTER — Inpatient Hospital Stay (HOSPITAL_COMMUNITY): Payer: BLUE CROSS/BLUE SHIELD

## 2017-05-01 ENCOUNTER — Inpatient Hospital Stay (HOSPITAL_COMMUNITY)
Admission: RE | Admit: 2017-05-01 | Payer: BLUE CROSS/BLUE SHIELD | Source: Ambulatory Visit | Admitting: Neurological Surgery

## 2017-05-01 ENCOUNTER — Inpatient Hospital Stay (HOSPITAL_COMMUNITY): Payer: BLUE CROSS/BLUE SHIELD | Admitting: Certified Registered Nurse Anesthetist

## 2017-05-01 ENCOUNTER — Encounter (HOSPITAL_COMMUNITY): Payer: Self-pay | Admitting: *Deleted

## 2017-05-01 HISTORY — PX: APPLICATION OF ROBOTIC ASSISTANCE FOR SPINAL PROCEDURE: SHX6753

## 2017-05-01 HISTORY — PX: LAMINECTOMY: SHX219

## 2017-05-01 HISTORY — PX: POSTERIOR LUMBAR FUSION 4 LEVEL: SHX6037

## 2017-05-01 LAB — PROTIME-INR
INR: 1.09
Prothrombin Time: 14.1 seconds (ref 11.4–15.2)

## 2017-05-01 LAB — CBC
HCT: 42.4 % (ref 39.0–52.0)
Hemoglobin: 15 g/dL (ref 13.0–17.0)
MCH: 31.8 pg (ref 26.0–34.0)
MCHC: 35.4 g/dL (ref 30.0–36.0)
MCV: 90 fL (ref 78.0–100.0)
Platelets: 203 10*3/uL (ref 150–400)
RBC: 4.71 MIL/uL (ref 4.22–5.81)
RDW: 12.5 % (ref 11.5–15.5)
WBC: 9 10*3/uL (ref 4.0–10.5)

## 2017-05-01 LAB — APTT: aPTT: 25 seconds (ref 24–36)

## 2017-05-01 LAB — POCT I-STAT 7, (LYTES, BLD GAS, ICA,H+H)
Acid-base deficit: 5 mmol/L — ABNORMAL HIGH (ref 0.0–2.0)
Bicarbonate: 20.6 mmol/L (ref 20.0–28.0)
Calcium, Ion: 0.86 mmol/L — CL (ref 1.15–1.40)
HCT: 34 % — ABNORMAL LOW (ref 39.0–52.0)
Hemoglobin: 11.6 g/dL — ABNORMAL LOW (ref 13.0–17.0)
O2 SAT: 100 %
PCO2 ART: 39 mmHg (ref 32.0–48.0)
PH ART: 7.324 — AB (ref 7.350–7.450)
PO2 ART: 234 mmHg — AB (ref 83.0–108.0)
Patient temperature: 35.8
Potassium: 3.1 mmol/L — ABNORMAL LOW (ref 3.5–5.1)
Sodium: 165 mmol/L (ref 135–145)
TCO2: 22 mmol/L (ref 22–32)

## 2017-05-01 LAB — POCT I-STAT 4, (NA,K, GLUC, HGB,HCT)
GLUCOSE: 125 mg/dL — AB (ref 65–99)
HEMATOCRIT: 38 % — AB (ref 39.0–52.0)
HEMOGLOBIN: 12.9 g/dL — AB (ref 13.0–17.0)
Potassium: 4.5 mmol/L (ref 3.5–5.1)
SODIUM: 133 mmol/L — AB (ref 135–145)

## 2017-05-01 LAB — BASIC METABOLIC PANEL
Anion gap: 8 (ref 5–15)
BUN: 7 mg/dL (ref 6–20)
CO2: 27 mmol/L (ref 22–32)
Calcium: 9.2 mg/dL (ref 8.9–10.3)
Chloride: 96 mmol/L — ABNORMAL LOW (ref 101–111)
Creatinine, Ser: 1 mg/dL (ref 0.61–1.24)
GFR calc Af Amer: >60 mL/min (ref >60)
GFR calc non Af Amer: >60 mL/min (ref >60)
Glucose, Bld: 122 mg/dL — ABNORMAL HIGH (ref 65–99)
Potassium: 4.3 mmol/L (ref 3.5–5.1)
Sodium: 131 mmol/L — ABNORMAL LOW (ref 135–145)

## 2017-05-01 SURGERY — POSTERIOR LUMBAR FUSION 4 LEVEL
Anesthesia: General | Site: Back

## 2017-05-01 MED ORDER — CELECOXIB 200 MG PO CAPS
200.0000 mg | ORAL_CAPSULE | Freq: Two times a day (BID) | ORAL | Status: DC
  Administered 2017-05-01 – 2017-05-04 (×6): 200 mg via ORAL
  Filled 2017-05-01 (×6): qty 1

## 2017-05-01 MED ORDER — SUGAMMADEX SODIUM 200 MG/2ML IV SOLN
INTRAVENOUS | Status: DC | PRN
  Administered 2017-05-01: 100 mg via INTRAVENOUS

## 2017-05-01 MED ORDER — CEFAZOLIN SODIUM-DEXTROSE 2-4 GM/100ML-% IV SOLN
2.0000 g | Freq: Three times a day (TID) | INTRAVENOUS | Status: AC
  Administered 2017-05-01 – 2017-05-02 (×2): 2 g via INTRAVENOUS
  Filled 2017-05-01 (×2): qty 100

## 2017-05-01 MED ORDER — TRANEXAMIC ACID 1000 MG/10ML IV SOLN
INTRAVENOUS | Status: DC
  Administered 2017-05-01: 10 mL/h via INTRAVENOUS
  Filled 2017-05-01: qty 200

## 2017-05-01 MED ORDER — HYDROMORPHONE HCL 1 MG/ML IJ SOLN
INTRAMUSCULAR | Status: AC
  Filled 2017-05-01: qty 1

## 2017-05-01 MED ORDER — CEFAZOLIN SODIUM-DEXTROSE 2-3 GM-% IV SOLR
INTRAVENOUS | Status: DC | PRN
  Administered 2017-05-01 (×2): 2 g via INTRAVENOUS

## 2017-05-01 MED ORDER — ARTIFICIAL TEARS OPHTHALMIC OINT
TOPICAL_OINTMENT | OPHTHALMIC | Status: DC | PRN
  Administered 2017-05-01: 1 via OPHTHALMIC

## 2017-05-01 MED ORDER — SODIUM CHLORIDE 0.9 % IR SOLN
Status: DC | PRN
  Administered 2017-05-01: 500 mL
  Administered 2017-05-01: 09:00:00

## 2017-05-01 MED ORDER — THROMBIN 5000 UNITS EX SOLR
CUTANEOUS | Status: AC
  Filled 2017-05-01: qty 5000

## 2017-05-01 MED ORDER — LIDOCAINE-EPINEPHRINE 2 %-1:100000 IJ SOLN
INTRAMUSCULAR | Status: DC | PRN
  Administered 2017-05-01: 15 mL

## 2017-05-01 MED ORDER — VANCOMYCIN HCL 1000 MG IV SOLR
INTRAVENOUS | Status: DC | PRN
  Administered 2017-05-01: 1000 mg

## 2017-05-01 MED ORDER — DEXAMETHASONE SODIUM PHOSPHATE 4 MG/ML IJ SOLN
INTRAMUSCULAR | Status: DC | PRN
  Administered 2017-05-01: 10 mg via INTRAVENOUS

## 2017-05-01 MED ORDER — BUPIVACAINE LIPOSOME 1.3 % IJ SUSP
20.0000 mL | INTRAMUSCULAR | Status: AC
  Administered 2017-05-01: 20 mL
  Filled 2017-05-01: qty 20

## 2017-05-01 MED ORDER — SODIUM CHLORIDE 0.9 % IR SOLN
Status: DC | PRN
  Administered 2017-05-01: 1000 mL

## 2017-05-01 MED ORDER — LIDOCAINE-EPINEPHRINE 2 %-1:100000 IJ SOLN
INTRAMUSCULAR | Status: AC
  Filled 2017-05-01: qty 1

## 2017-05-01 MED ORDER — THROMBIN 20000 UNITS EX SOLR
CUTANEOUS | Status: AC
  Filled 2017-05-01: qty 20000

## 2017-05-01 MED ORDER — LACTATED RINGERS IV SOLN
INTRAVENOUS | Status: DC | PRN
  Administered 2017-05-01 (×2): via INTRAVENOUS

## 2017-05-01 MED ORDER — SODIUM CHLORIDE 0.9 % IJ SOLN
INTRAMUSCULAR | Status: DC | PRN
  Administered 2017-05-01 (×3): 10 mL

## 2017-05-01 MED ORDER — ONDANSETRON HCL 4 MG/2ML IJ SOLN
INTRAMUSCULAR | Status: DC | PRN
  Administered 2017-05-01: 4 mg via INTRAVENOUS

## 2017-05-01 MED ORDER — BUPIVACAINE-EPINEPHRINE (PF) 0.5% -1:200000 IJ SOLN
INTRAMUSCULAR | Status: AC
  Filled 2017-05-01: qty 30

## 2017-05-01 MED ORDER — OXYCODONE HCL ER 20 MG PO T12A
20.0000 mg | EXTENDED_RELEASE_TABLET | Freq: Two times a day (BID) | ORAL | Status: DC
  Administered 2017-05-02 – 2017-05-04 (×6): 20 mg via ORAL
  Filled 2017-05-01 (×6): qty 1

## 2017-05-01 MED ORDER — THROMBIN 5000 UNITS EX SOLR
OROMUCOSAL | Status: DC | PRN
  Administered 2017-05-01 (×2): via TOPICAL
  Administered 2017-05-01 (×2): 5 mL via TOPICAL

## 2017-05-01 MED ORDER — MEPERIDINE HCL 25 MG/ML IJ SOLN: 6.2500 mg | INTRAMUSCULAR | Status: DC | PRN

## 2017-05-01 MED ORDER — ROCURONIUM BROMIDE 100 MG/10ML IV SOLN
INTRAVENOUS | Status: DC | PRN
  Administered 2017-05-01 (×5): 50 mg via INTRAVENOUS

## 2017-05-01 MED ORDER — OXYCODONE HCL ER 20 MG PO T12A: 20.0000 mg | EXTENDED_RELEASE_TABLET | Freq: Two times a day (BID) | ORAL | Status: DC

## 2017-05-01 MED ORDER — PHENYLEPHRINE HCL 10 MG/ML IJ SOLN
INTRAVENOUS | Status: DC | PRN
  Administered 2017-05-01: 25 ug/min via INTRAVENOUS

## 2017-05-01 MED ORDER — 0.9 % SODIUM CHLORIDE (POUR BTL) OPTIME
TOPICAL | Status: DC | PRN
  Administered 2017-05-01: 1000 mL

## 2017-05-01 MED ORDER — FENTANYL CITRATE (PF) 100 MCG/2ML IJ SOLN
INTRAMUSCULAR | Status: DC | PRN
  Administered 2017-05-01: 100 ug via INTRAVENOUS
  Administered 2017-05-01: 50 ug via INTRAVENOUS
  Administered 2017-05-01: 100 ug via INTRAVENOUS
  Administered 2017-05-01: 50 ug via INTRAVENOUS
  Administered 2017-05-01: 100 ug via INTRAVENOUS
  Administered 2017-05-01: 150 ug via INTRAVENOUS

## 2017-05-01 MED ORDER — LACTATED RINGERS IV SOLN
INTRAVENOUS | Status: DC | PRN
  Administered 2017-05-01 (×2): via INTRAVENOUS

## 2017-05-01 MED ORDER — BUPIVACAINE-EPINEPHRINE (PF) 0.5% -1:200000 IJ SOLN
INTRAMUSCULAR | Status: DC | PRN
  Administered 2017-05-01: 15 mL

## 2017-05-01 MED ORDER — THROMBIN 20000 UNITS EX SOLR
CUTANEOUS | Status: DC | PRN
  Administered 2017-05-01: 09:00:00 via TOPICAL

## 2017-05-01 MED ORDER — METHOCARBAMOL 750 MG PO TABS
750.0000 mg | ORAL_TABLET | Freq: Four times a day (QID) | ORAL | Status: DC
  Administered 2017-05-01 – 2017-05-04 (×10): 750 mg via ORAL
  Filled 2017-05-01 (×11): qty 1

## 2017-05-01 MED ORDER — ONDANSETRON HCL 4 MG/2ML IJ SOLN: 4.0000 mg | Freq: Once | INTRAMUSCULAR | Status: DC | PRN

## 2017-05-01 MED ORDER — PROPOFOL 10 MG/ML IV BOLUS
INTRAVENOUS | Status: DC | PRN
  Administered 2017-05-01: 130 mg via INTRAVENOUS

## 2017-05-01 MED ORDER — SODIUM CHLORIDE 0.9 % IJ SOLN
INTRAMUSCULAR | Status: AC
  Filled 2017-05-01: qty 20

## 2017-05-01 MED ORDER — SODIUM CHLORIDE 0.9 % IJ SOLN
INTRAMUSCULAR | Status: AC
  Filled 2017-05-01: qty 10

## 2017-05-01 MED ORDER — DIAZEPAM 5 MG PO TABS
5.0000 mg | ORAL_TABLET | Freq: Three times a day (TID) | ORAL | Status: DC | PRN
  Administered 2017-05-02: 5 mg via ORAL
  Filled 2017-05-01 (×2): qty 1

## 2017-05-01 MED ORDER — HYDROMORPHONE HCL 1 MG/ML IJ SOLN
0.2500 mg | INTRAMUSCULAR | Status: DC | PRN
  Administered 2017-05-01 (×4): 0.5 mg via INTRAVENOUS

## 2017-05-01 MED ORDER — PHENYLEPHRINE HCL 10 MG/ML IJ SOLN
INTRAMUSCULAR | Status: DC | PRN
  Administered 2017-05-01: 80 ug via INTRAVENOUS
  Administered 2017-05-01 (×2): 40 ug via INTRAVENOUS
  Administered 2017-05-01: 120 ug via INTRAVENOUS
  Administered 2017-05-01 (×2): 80 ug via INTRAVENOUS
  Administered 2017-05-01: 40 ug via INTRAVENOUS

## 2017-05-01 MED ORDER — ACETAMINOPHEN 500 MG PO TABS
1000.0000 mg | ORAL_TABLET | Freq: Four times a day (QID) | ORAL | Status: DC
  Administered 2017-05-01 – 2017-05-04 (×9): 1000 mg via ORAL
  Filled 2017-05-01 (×8): qty 2

## 2017-05-01 MED ORDER — MIDAZOLAM HCL 5 MG/5ML IJ SOLN
INTRAMUSCULAR | Status: DC | PRN
  Administered 2017-05-01: 2 mg via INTRAVENOUS

## 2017-05-01 MED ORDER — LIDOCAINE HCL (CARDIAC) 20 MG/ML IV SOLN
INTRAVENOUS | Status: DC | PRN
  Administered 2017-05-01: 100 mg via INTRAVENOUS

## 2017-05-01 MED ORDER — THROMBIN 5000 UNITS EX SOLR
CUTANEOUS | Status: AC
  Filled 2017-05-01: qty 10000

## 2017-05-01 MED ORDER — VANCOMYCIN HCL 1000 MG IV SOLR
INTRAVENOUS | Status: AC
  Filled 2017-05-01: qty 1000

## 2017-05-01 SURGICAL SUPPLY — 101 items
ATEC PORO TI PS 10D 9W 25X8X10 (Bone Implant) ×8 IMPLANT
BAG DECANTER FOR FLEXI CONT (MISCELLANEOUS) ×4 IMPLANT
BIT DRILL LONG 3.0X30 (BIT) IMPLANT
BIT DRILL LONG 3.0X30MM (BIT)
BIT DRILL LONG 3X80 (BIT) IMPLANT
BIT DRILL LONG 3X80MM (BIT)
BIT DRILL LONG 4X80 (BIT) IMPLANT
BIT DRILL LONG 4X80MM (BIT)
BIT DRILL SHORT 3.0X30 (BIT) IMPLANT
BIT DRILL SHORT 3.0X30MM (BIT)
BIT DRILL SHORT 3X80 (BIT) IMPLANT
BIT DRILL SHORT 3X80MM (BIT)
BLADE SURG 11 STRL SS (BLADE) ×8 IMPLANT
BUR MATCHSTICK NEURO 3.0 LAGG (BURR) ×4 IMPLANT
BUR RND OSTEON ELITE 6.0 (BURR) ×3 IMPLANT
BUR RND OSTEON ELITE 6.0MM (BURR) ×1
BUR ROUND FLUTED 5 RND (BURR) IMPLANT
BUR ROUND FLUTED 5MM RND (BURR)
CANISTER SUCT 3000ML PPV (MISCELLANEOUS) ×8 IMPLANT
CARTRIDGE OIL MAESTRO DRILL (MISCELLANEOUS) ×2 IMPLANT
CHLORAPREP W/TINT 26ML (MISCELLANEOUS) ×4 IMPLANT
DERMABOND ADVANCED (GAUZE/BANDAGES/DRESSINGS) ×4
DERMABOND ADVANCED .7 DNX12 (GAUZE/BANDAGES/DRESSINGS) ×4 IMPLANT
DRAPE C-ARM 42X72 X-RAY (DRAPES) ×12 IMPLANT
DRAPE C-ARMOR (DRAPES) ×4 IMPLANT
DRAPE INCISE IOBAN 66X45 STRL (DRAPES) ×4 IMPLANT
DRAPE POUCH INSTRU U-SHP 10X18 (DRAPES) ×4 IMPLANT
DRAPE SHEET LG 3/4 BI-LAMINATE (DRAPES) ×4 IMPLANT
DRAPE SURG 17X23 STRL (DRAPES) ×4 IMPLANT
DRSG OPSITE 4X5.5 SM (GAUZE/BANDAGES/DRESSINGS) ×4 IMPLANT
DRSG OPSITE POSTOP 3X4 (GAUZE/BANDAGES/DRESSINGS) ×4 IMPLANT
DRSG OPSITE POSTOP 4X10 (GAUZE/BANDAGES/DRESSINGS) ×4 IMPLANT
ELECT COATED BLADE 2.86 ST (ELECTRODE) ×4 IMPLANT
ELECT REM PT RETURN 9FT ADLT (ELECTROSURGICAL) ×4
ELECTRODE REM PT RTRN 9FT ADLT (ELECTROSURGICAL) ×2 IMPLANT
EVACUATOR 1/8 PVC DRAIN (DRAIN) ×4 IMPLANT
GAUZE SPONGE 4X4 12PLY STRL (GAUZE/BANDAGES/DRESSINGS) IMPLANT
GAUZE SPONGE 4X4 16PLY XRAY LF (GAUZE/BANDAGES/DRESSINGS) ×4 IMPLANT
GLOVE BIO SURGEON STRL SZ7 (GLOVE) IMPLANT
GLOVE BIO SURGEON STRL SZ8 (GLOVE) ×4 IMPLANT
GLOVE BIO SURGEON STRL SZ8.5 (GLOVE) ×4 IMPLANT
GLOVE BIOGEL PI IND STRL 7.0 (GLOVE) ×8 IMPLANT
GLOVE BIOGEL PI IND STRL 7.5 (GLOVE) ×12 IMPLANT
GLOVE BIOGEL PI INDICATOR 7.0 (GLOVE) ×8
GLOVE BIOGEL PI INDICATOR 7.5 (GLOVE) ×12
GLOVE SS BIOGEL STRL SZ 7.5 (GLOVE) ×6 IMPLANT
GLOVE SUPERSENSE BIOGEL SZ 7.5 (GLOVE) ×6
GOWN STRL REUS W/ TWL LRG LVL3 (GOWN DISPOSABLE) ×4 IMPLANT
GOWN STRL REUS W/ TWL XL LVL3 (GOWN DISPOSABLE) ×4 IMPLANT
GOWN STRL REUS W/TWL LRG LVL3 (GOWN DISPOSABLE) ×4
GOWN STRL REUS W/TWL XL LVL3 (GOWN DISPOSABLE) ×4
GRAFT BN 10X1XDBM MAGNIFUSE (Bone Implant) ×2 IMPLANT
GRAFT BN 5X1XSPNE CVD POST DBM (Bone Implant) ×2 IMPLANT
GRAFT BONE MAGNIFUSE 1X10CM (Bone Implant) ×2 IMPLANT
GRAFT BONE MAGNIFUSE 1X5CM (Bone Implant) ×2 IMPLANT
HEMOSTAT POWDER KIT SURGIFOAM (HEMOSTASIS) ×4 IMPLANT
HEMOSTAT POWDER SURGIFOAM 1G (HEMOSTASIS) ×8 IMPLANT
KIT BASIN OR (CUSTOM PROCEDURE TRAY) ×4 IMPLANT
KIT INFUSE MEDIUM (Orthopedic Implant) ×4 IMPLANT
KIT ROOM TURNOVER OR (KITS) ×4 IMPLANT
KIT SPINE MAZOR X ROBO DISP (MISCELLANEOUS) ×4 IMPLANT
MILL MEDIUM DISP (BLADE) ×4 IMPLANT
NEEDLE HYPO 18GX1.5 BLUNT FILL (NEEDLE) IMPLANT
NEEDLE HYPO 21X1.5 SAFETY (NEEDLE) ×8 IMPLANT
NEEDLE HYPO 25X1 1.5 SAFETY (NEEDLE) IMPLANT
NS IRRIG 1000ML POUR BTL (IV SOLUTION) ×4 IMPLANT
OIL CARTRIDGE MAESTRO DRILL (MISCELLANEOUS) ×4
PACK LAMINECTOMY NEURO (CUSTOM PROCEDURE TRAY) ×4 IMPLANT
PACK UNIVERSAL I (CUSTOM PROCEDURE TRAY) ×4 IMPLANT
PAD ARMBOARD 7.5X6 YLW CONV (MISCELLANEOUS) ×12 IMPLANT
PATTIES SURGICAL .5X1.5 (GAUZE/BANDAGES/DRESSINGS) ×4 IMPLANT
PATTIES SURGICAL 1X1 (DISPOSABLE) ×4 IMPLANT
PIN HEAD 2.5X60MM (PIN) IMPLANT
ROD UNID TI 05.5MM-04 MEDICREA (Rod) ×8 IMPLANT
SCREW ARSENAL 5.5X45MM POLY (Screw) ×16 IMPLANT
SCREW ARSENAL POLY 8.5X90MM (Screw) ×8 IMPLANT
SCREW PA 6.5X45 ARSENAL (Screw) ×16 IMPLANT
SCREW PA 7.5X40 ARSENAL (Screw) ×8 IMPLANT
SCREW SCHANZ SA 4.0MM (MISCELLANEOUS) IMPLANT
SCREW SET SPINAL ARSENAL 47127 (Screw) ×48 IMPLANT
SEALER BIPOLAR AQUA 2.3 (INSTRUMENTS) ×8 IMPLANT
SPACER PORUS ATEC 10D9W25X8X10 (Bone Implant) ×4 IMPLANT
SPONGE NEURO XRAY DETECT 1X3 (DISPOSABLE) ×4 IMPLANT
SPONGE SURGIFOAM ABS GEL 100 (HEMOSTASIS) ×4 IMPLANT
SUT STRATAFIX 1PDS 45CM VIOLET (SUTURE) ×4 IMPLANT
SUT STRATAFIX MNCRL+ 3-0 PS-2 (SUTURE) ×2
SUT STRATAFIX MONOCRYL 3-0 (SUTURE) ×2
SUT STRATAFIX SPIRAL + 2-0 (SUTURE) ×4 IMPLANT
SUT VIC AB 0 CT1 18XCR BRD8 (SUTURE) ×4 IMPLANT
SUT VIC AB 0 CT1 27 (SUTURE) ×2
SUT VIC AB 0 CT1 27XBRD ANBCTR (SUTURE) ×2 IMPLANT
SUT VIC AB 0 CT1 8-18 (SUTURE) ×4
SUT VIC AB 2-0 CT1 18 (SUTURE) IMPLANT
SUT VIC AB 3-0 SH 8-18 (SUTURE) IMPLANT
SUT VIC AB 4-0 PS2 27 (SUTURE) IMPLANT
SUTURE STRATFX MNCRL+ 3-0 PS-2 (SUTURE) ×2 IMPLANT
SYR 30ML LL (SYRINGE) ×8 IMPLANT
TIP TROCAR NITINOL ILLICO 20 (INSTRUMENTS) ×48 IMPLANT
TOWEL GREEN STERILE (TOWEL DISPOSABLE) ×4 IMPLANT
TOWEL GREEN STERILE FF (TOWEL DISPOSABLE) ×4 IMPLANT
WATER STERILE IRR 1000ML POUR (IV SOLUTION) ×4 IMPLANT

## 2017-05-01 NOTE — Progress Notes (Signed)
Pt seen and examined. No issues overnight.  EXAM: Temp:  [97.7 F (36.5 C)-98.5 F (36.9 C)] 97.8 F (36.6 C) (10/03 0640) Pulse Rate:  [66-91] 88 (10/03 0640) Resp:  [8-21] 19 (10/03 0640) BP: (116-155)/(64-95) 135/78 (10/03 0640) SpO2:  [95 %-100 %] 100 % (10/03 0640) Intake/Output      10/02 0701 - 10/03 0700 10/03 0701 - 10/04 0700   I.V. 2000    Total Intake 2000     Urine 2545    Blood 50    Total Output 2595     Net -595           Awake and alert Follows commands throughout Full strength  Stable Ready for stage 2 today

## 2017-05-01 NOTE — Anesthesia Preprocedure Evaluation (Signed)
Anesthesia Evaluation  Patient identified by MRN, date of birth, ID band Patient awake    Reviewed: Allergy & Precautions, NPO status , Patient's Chart, lab work & pertinent test results  Airway Mallampati: I  TM Distance: >3 FB Neck ROM: Full    Dental   Pulmonary former smoker,    Pulmonary exam normal        Cardiovascular hypertension, Pt. on medications Normal cardiovascular exam     Neuro/Psych Anxiety Depression    GI/Hepatic   Endo/Other    Renal/GU      Musculoskeletal   Abdominal   Peds  Hematology   Anesthesia Other Findings   Reproductive/Obstetrics                             Anesthesia Physical Anesthesia Plan  ASA: II  Anesthesia Plan: General   Post-op Pain Management:    Induction: Intravenous  PONV Risk Score and Plan: 2 and Ondansetron and Dexamethasone  Airway Management Planned: Oral ETT  Additional Equipment: Arterial line  Intra-op Plan:   Post-operative Plan: Extubation in OR  Informed Consent: I have reviewed the patients History and Physical, chart, labs and discussed the procedure including the risks, benefits and alternatives for the proposed anesthesia with the patient or authorized representative who has indicated his/her understanding and acceptance.     Plan Discussed with: CRNA and Surgeon  Anesthesia Plan Comments:         Anesthesia Quick Evaluation

## 2017-05-01 NOTE — Anesthesia Postprocedure Evaluation (Signed)
Anesthesia Post Note  Patient: Alyxander Kollmann  Procedure(s) Performed: Stage 1: Left Lumbar two -three Transpsoas lumbar interbody fusion with Right Lumbar three-four, Lumbar four -five Transpsoas lumbar interbody fusion (N/A Spine Lumbar)     Patient location during evaluation: PACU Anesthesia Type: General Level of consciousness: awake and alert Pain management: pain level controlled Vital Signs Assessment: post-procedure vital signs reviewed and stable Respiratory status: spontaneous breathing, nonlabored ventilation, respiratory function stable and patient connected to nasal cannula oxygen Cardiovascular status: blood pressure returned to baseline and stable Postop Assessment: no apparent nausea or vomiting Anesthetic complications: no    Last Vitals:  Vitals:   05/01/17 1750 05/01/17 1800  BP: (!) 139/105   Pulse: 100 99  Resp: (!) 21 18  Temp:    SpO2: 95% 96%    Last Pain:  Vitals:   05/01/17 1515  TempSrc:   PainSc: Asleep                 Lizmary Nader

## 2017-05-01 NOTE — Progress Notes (Signed)
PT Cancellation Note  Patient Details Name: Seth Colon MRN: [id number] DOB: 09-14-1960   Cancelled Treatment:    Reason Eval/Treat Not Completed: Patient at procedure or test/unavailable. Pt currently in OR for part 2 of surgery. Will continue to follow.    Marylynn Pearson 05/01/2017, 7:42 AM   Conni Slipper, PT, DPT Acute Rehabilitation Services Pager: [phone]

## 2017-05-01 NOTE — Anesthesia Procedure Notes (Signed)
Arterial Line Insertion Start/End10/09/2016 8:10 AM Performed by: Philomena Course, CRNA  Patient location: Pre-op. Lidocaine 1% used for infiltration Left, radial was placed Catheter size: 20 G Hand hygiene performed  and maximum sterile barriers used  Allen's test indicative of satisfactory collateral circulation Attempts: 2 Procedure performed without using ultrasound guided technique. Post procedure assessment: normal  Additional procedure comments: A-line place by Hassel Neth.

## 2017-05-01 NOTE — Transfer of Care (Signed)
Immediate Anesthesia Transfer of Care Note  Patient: Seth Colon  Procedure(s) Performed: Lumbar Two-Sacral One Laminectomy for decompression with Lumbar two to Pelvis fixation,fusion/Mazor (N/A Back) APPLICATION OF ROBOTIC ASSISTANCE FOR SPINAL PROCEDURE (N/A )  Patient Location: PACU  Anesthesia Type:General  Level of Consciousness: awake, alert  and patient cooperative  Airway & Oxygen Therapy: Patient Spontanous Breathing and Patient connected to nasal cannula oxygen  Post-op Assessment: Report given to RN and Post -op Vital signs reviewed and stable  Post vital signs: Reviewed and stable  Last Vitals:  Vitals:   05/01/17 0454 05/01/17 0640  BP: 138/80 135/78  Pulse: 91 88  Resp: 18 19  Temp: 36.5 C 36.6 C  SpO2: 100% 100%    Last Pain:  Vitals:   05/01/17 0640  TempSrc: Oral  PainSc:       Patients Stated Pain Goal: 3 (04/30/17 1957)  Complications: No apparent anesthesia complications

## 2017-05-01 NOTE — Brief Op Note (Signed)
04/30/2017 - 05/01/2017  1:48 PM  PATIENT:  Seth Colon  56 y.o. male  PRE-OPERATIVE DIAGNOSIS:  Lumbosacral spondylosis with radiculopathy  POST-OPERATIVE DIAGNOSIS:  Lumbosacral spondylosis with radiculopathy  PROCEDURE:  Procedure(s): Lumbar Two-Sacral One Laminectomy for decompression with Lumbar two to Pelvis fixation,fusion/Mazor (N/A) APPLICATION OF ROBOTIC ASSISTANCE FOR SPINAL PROCEDURE (N/A)  SURGEON:  Surgeon(s) and Role:    * Ditty, Loura Halt, MD - Primary    * Tressie Stalker, MD - Assisting  PHYSICIAN ASSISTANT:   ASSISTANTS: Tressie Stalker, MD   ANESTHESIA:   general  EBL:  Total I/O In: 2270 [I.V.:2000; Blood:270] Out: 1610 [Urine:810; Blood:800]  BLOOD ADMINISTERED:none  DRAINS: Medium hemovac   LOCAL MEDICATIONS USED:  MARCAINE    and LIDOCAINE   SPECIMEN:  No Specimen  DISPOSITION OF SPECIMEN:  N/A  COUNTS:  YES  TOURNIQUET:  * No tourniquets in log *  DICTATION: .Dragon Dictation  PLAN OF CARE: Admit to inpatient   PATIENT DISPOSITION:  PACU - hemodynamically stable.   Delay start of Pharmacological VTE agent (>24hrs) due to surgical blood loss or risk of bleeding: yes

## 2017-05-01 NOTE — Anesthesia Procedure Notes (Addendum)
Procedure Name: Intubation Date/Time: 05/01/2017 8:39 AM Performed by: Salli Quarry Niaja Stickley Pre-anesthesia Checklist: Patient identified, Emergency Drugs available, Suction available and Patient being monitored Patient Re-evaluated:Patient Re-evaluated prior to induction Oxygen Delivery Method: Circle System Utilized Preoxygenation: Pre-oxygenation with 100% oxygen Induction Type: IV induction Ventilation: Mask ventilation without difficulty Laryngoscope Size: Mac and 4 Grade View: Grade II Tube type: Oral Tube size: 7.5 mm Number of attempts: 1 Airway Equipment and Method: Stylet and Oral airway Placement Confirmation: ETT inserted through vocal cords under direct vision,  positive ETCO2 and breath sounds checked- equal and bilateral Secured at: 24 cm Tube secured with: Tape Dental Injury: Teeth and Oropharynx as per pre-operative assessment  Comments: Intubation by Lafonda Mosses, SRNA

## 2017-05-01 NOTE — Progress Notes (Signed)
OT Cancellation Note  Patient Details Name: Seth Colon MRN: [id number] DOB: 06-13-1961   Cancelled Treatment:    Reason Eval/Treat Not Completed: Patient at procedure or test/ unavailable (return to OR for part 2)  Gaye Alken M.S., OTR/L Pager: [phone]  05/01/2017, 7:30 AM

## 2017-05-02 ENCOUNTER — Encounter (HOSPITAL_COMMUNITY): Payer: Self-pay | Admitting: General Practice

## 2017-05-02 LAB — POCT I-STAT 7, (LYTES, BLD GAS, ICA,H+H)
ACID-BASE EXCESS: 4 mmol/L — AB (ref 0.0–2.0)
BICARBONATE: 28.9 mmol/L — AB (ref 20.0–28.0)
CALCIUM ION: 1.12 mmol/L — AB (ref 1.15–1.40)
HCT: 38 % — ABNORMAL LOW (ref 39.0–52.0)
Hemoglobin: 12.9 g/dL — ABNORMAL LOW (ref 13.0–17.0)
O2 Saturation: 100 %
PH ART: 7.446 (ref 7.350–7.450)
Potassium: 4.6 mmol/L (ref 3.5–5.1)
SODIUM: 132 mmol/L — AB (ref 135–145)
TCO2: 30 mmol/L (ref 22–32)
pCO2 arterial: 41.8 mmHg (ref 32.0–48.0)
pO2, Arterial: 242 mmHg — ABNORMAL HIGH (ref 83.0–108.0)

## 2017-05-02 MED ORDER — LISINOPRIL 40 MG PO TABS
40.0000 mg | ORAL_TABLET | Freq: Every day | ORAL | Status: DC
  Administered 2017-05-02 – 2017-05-04 (×3): 40 mg via ORAL
  Filled 2017-05-02 (×3): qty 1

## 2017-05-02 MED ORDER — PROPRANOLOL HCL 20 MG PO TABS
20.0000 mg | ORAL_TABLET | Freq: Every day | ORAL | Status: DC
  Administered 2017-05-02 – 2017-05-04 (×3): 20 mg via ORAL
  Filled 2017-05-02 (×3): qty 1

## 2017-05-02 MED ORDER — AMLODIPINE BESYLATE 10 MG PO TABS
10.0000 mg | ORAL_TABLET | Freq: Every day | ORAL | Status: DC
  Administered 2017-05-02 – 2017-05-04 (×3): 10 mg via ORAL
  Filled 2017-05-02 (×3): qty 1

## 2017-05-02 MED ORDER — INFLUENZA VAC SPLIT QUAD 0.5 ML IM SUSY
0.5000 mL | PREFILLED_SYRINGE | INTRAMUSCULAR | Status: AC
  Administered 2017-05-03: 0.5 mL via INTRAMUSCULAR
  Filled 2017-05-02: qty 0.5

## 2017-05-02 MED FILL — Sodium Chloride IV Soln 0.9%: INTRAVENOUS | Qty: 2000 | Status: AC

## 2017-05-02 MED FILL — Heparin Sodium (Porcine) Inj 1000 Unit/ML: INTRAMUSCULAR | Qty: 30 | Status: AC

## 2017-05-02 NOTE — Progress Notes (Signed)
Physical Therapy Treatment Patient Details Name: Seth Colon MRN: [id number] DOB: [birth year] Today's Date: 05/02/2017    History of Present Illness Pt is a 56 y.o. male with adult degenerative scoliosis and lumbar radiculopathy, now s/p L2-4 ALIF on 04/30/17; now s/p part 2 of sx for L2-pelvis fixation on 05/01/17. PHM including anxiety, arthritis, depression, HTN, and cervical fusion (Jan 2018).   PT Comments    Pt progressing well with mobility, now s/p secondary lumbar sx on 10/3. Remains motivated to participate with therapy despite increased pain. Able to transfer and amb with RW and min guard for balance, progressing to supervision by end of session. Ascend/descended steps with supervision. Pt able to recall 3/3 back precautions. Feel pt will be safe to return home with supervision for mobility; d/c recs updated to reflect this. Will continue to follow acutely.   Follow Up Recommendations  No PT follow up;Supervision for mobility/OOB     Equipment Recommendations  Rolling walker with 5" wheels    Recommendations for Other Services       Precautions / Restrictions Precautions Precautions: Back Precaution Booklet Issued: Yes (comment) Precaution Comments: Pt able to verbalize 3/3 back precautions Required Braces or Orthoses:  (No MD order) Other Brace/Splint: "No brace needed" Restrictions Weight Bearing Restrictions: No    Mobility  Bed Mobility               General bed mobility comments: Received sitting in chair. Pt with no concerns regarding log roll technique.  Transfers Overall transfer level: Needs assistance Equipment used: Rolling walker (2 wheeled) Transfers: Sit to/from Stand Sit to Stand: Min guard         General transfer comment: Min guard for safety. Good technique with RW and ability to maintain back precautions  Ambulation/Gait Ambulation/Gait assistance: Min guard;Supervision Ambulation Distance (Feet): 500 Feet Assistive device:  Rolling walker (2 wheeled) Gait Pattern/deviations: Step-through pattern;Decreased stride length;Antalgic;Trunk flexed Gait velocity: Decreased Gait velocity interpretation: <1.8 ft/sec, indicative of risk for recurrent falls General Gait Details: Slow, antalgic gait secondary to increased back and R hip pain; improved technique today with step-through pattern, and able to achieve upright posture with cues for technique. Progressing to supervision for amb   Stairs Stairs: Yes   Stair Management: One rail Right;Step to pattern;Sideways Number of Stairs: 4 General stair comments: Ascend/descended steps with BUE support on R-side rail to simulate steps pt has to enter home; supervision for safety. Sister present and educ on technique (she will be assisting pt at d/c)  Wheelchair Mobility    Modified Rankin (Stroke Patients Only)       Balance Overall balance assessment: Needs assistance Sitting-balance support: No upper extremity supported;Feet supported Sitting balance-Leahy Scale: Good     Standing balance support: Bilateral upper extremity supported;During functional activity;No upper extremity supported Standing balance-Leahy Scale: Fair Standing balance comment: Able to brush teeth at sink with no UE support                            Cognition Arousal/Alertness: Awake/alert Behavior During Therapy: WFL for tasks assessed/performed Overall Cognitive Status: Within Functional Limits for tasks assessed                                        Exercises General Exercises - Lower Extremity Long Arc Quad: AROM;Both;10 reps;Seated Hip Flexion/Marching: AROM;Both;10 reps;Seated  General Comments General comments (skin integrity, edema, etc.): Sister present during session      Pertinent Vitals/Pain Pain Assessment: Faces Pain Score: 8  Faces Pain Scale: Hurts even more Pain Location: Back Pain Descriptors / Indicators: Sore;Operative site  guarding;Grimacing Pain Intervention(s): Monitored during session;Ice applied    Home Living                      Prior Function            PT Goals (current goals can now be found in the care plan section) Acute Rehab PT Goals Patient Stated Goal: Return home PT Goal Formulation: With patient Time For Goal Achievement: 04/30/17 Potential to Achieve Goals: Good Progress towards PT goals: Progressing toward goals    Frequency    Min 5X/week      PT Plan Discharge plan needs to be updated;Frequency needs to be updated    Co-evaluation              AM-PAC PT "6 Clicks" Daily Activity  Outcome Measure  Difficulty turning over in bed (including adjusting bedclothes, sheets and blankets)?: A Little Difficulty moving from lying on back to sitting on the side of the bed? : A Little Difficulty sitting down on and standing up from a chair with arms (e.g., wheelchair, bedside commode, etc,.)?: A Little Help needed moving to and from a bed to chair (including a wheelchair)?: A Little Help needed walking in hospital room?: A Little Help needed climbing 3-5 steps with a railing? : A Little 6 Click Score: 18    End of Session Equipment Utilized During Treatment: Gait belt Activity Tolerance: Patient tolerated treatment well Patient left: in chair;with call bell/phone within reach Nurse Communication: Mobility status PT Visit Diagnosis: Unsteadiness on feet (R26.81);Other abnormalities of gait and mobility (R26.89)     Time: 1202-1230 PT Time Calculation (min) (ACUTE ONLY): 28 min  Charges:  $Gait Training: 23-37 mins                    G Codes:      Ina Homes, PT, DPT Acute Rehab Services  Pager: [phone]  Malachy Chamber 05/02/2017, 12:42 PM

## 2017-05-02 NOTE — Therapy (Signed)
Occupational Therapy Treatment Patient Details Name: Seth Colon MRN: [id number] DOB: 10/07/1960 Today's Date: 05/02/2017    History of present illness Pt is a 56 y.o. male with adult degenerative scoliosis and lumbar radiculopathy, now s/p L2-4 ALIF on 04/30/17. PHM including anxiety, arthritis, depression, HTN, and cervical fusion (Jan 2018). 10/3 part 2 surgery  Lumbar two to Pelvis fixation,fusion   OT comments  Pt is making great progress towards OT goals. Pt completed shower transfer using a RW and 3in1 with min guard for safety. Pt overall min guard for functional mobility at RW level. Pt advised to have assist with transfer initially for safety. OT will continue to follow to address established goals.     Follow Up Recommendations  No OT follow up;Supervision/Assistance - 24 hour    Equipment Recommendations  3 in 1 bedside commode    Recommendations for Other Services      Precautions / Restrictions Precautions Precautions: Back Precaution Booklet Issued: Yes (comment) Precaution Comments: Pt able to verbalize 3/3 back precautions. Required Braces or Orthoses:  (No MD order ) Other Brace/Splint: "No brace needed" Restrictions Weight Bearing Restrictions: No       Mobility Bed Mobility               General bed mobility comments: Pt sitting in chair upon arrival. Verbally reviewed log roll technique. Pt reports no concerns about log roll technique.  Transfers Overall transfer level: Needs assistance Equipment used: Rolling walker (2 wheeled) Transfers: Sit to/from Stand Sit to Stand: Min guard         General transfer comment: Min guard for safety. Pt demonstrates good adherence to back precautions. VCs for hand placement on RW. Pt reported some dizziness with sit to stand.     Balance Overall balance assessment: Needs assistance Sitting-balance support: No upper extremity supported;Feet supported Sitting balance-Leahy Scale: Good     Standing  balance support: Bilateral upper extremity supported;During functional activity Standing balance-Leahy Scale: Fair Standing balance comment: Pt able to complete walk in shower transfer with RW and no LOB.                            ADL either performed or assessed with clinical judgement   ADL Overall ADL's : Needs assistance/impaired                                 Tub/ Shower Transfer: Walk-in shower;Min guard;Ambulation;3 in 1;Rolling walker Tub/Shower Transfer Details (indicate cue type and reason): Pt completed tub shower transfer with min guard for safety and verbal cues for technique. Pt advised to have family present to assist with shower transfer initially.  Functional mobility during ADLs: Min guard;Rolling walker General ADL Comments: Pt currently unable to bring feet over knees. Plan to educate pt on AE for LB ADLs and compensatory techniques for grooming during upcoming session.     Vision       Perception     Praxis      Cognition Arousal/Alertness: Awake/alert Behavior During Therapy: WFL for tasks assessed/performed Overall Cognitive Status: Within Functional Limits for tasks assessed                                          Exercises     Shoulder Instructions  General Comments Sister present during session.     Pertinent Vitals/ Pain       Pain Assessment: 0-10 Pain Score: 8  Pain Location: Back Pain Descriptors / Indicators: Constant;Sore;Operative site guarding Pain Intervention(s): Monitored during session;Ice applied  Home Living Family/patient expects to be discharged to:: Private residence Living Arrangements: Spouse/significant other                                      Prior Functioning/Environment              Frequency  Min 2X/week        Progress Toward Goals  OT Goals(current goals can now be found in the care plan section)  Progress towards OT goals:  Progressing toward goals  Acute Rehab OT Goals Patient Stated Goal: Return home OT Goal Formulation: With patient/family Time For Goal Achievement: 05/14/17 Potential to Achieve Goals: Good ADL Goals Pt Will Perform Lower Body Dressing: with supervision;with set-up;with adaptive equipment;sit to/from stand Pt Will Transfer to Toilet: with supervision;with set-up;bedside commode;ambulating Pt Will Perform Toileting - Clothing Manipulation and hygiene: with supervision;with set-up;sit to/from stand Pt Will Perform Tub/Shower Transfer: Shower transfer;3 in 1;ambulating;with set-up;with supervision  Plan Discharge plan remains appropriate    Co-evaluation                 AM-PAC PT "6 Clicks" Daily Activity     Outcome Measure   Help from another person eating meals?: None Help from another person taking care of personal grooming?: None Help from another person toileting, which includes using toliet, bedpan, or urinal?: A Little Help from another person bathing (including washing, rinsing, drying)?: A Little Help from another person to put on and taking off regular upper body clothing?: None Help from another person to put on and taking off regular lower body clothing?: A Little 6 Click Score: 21    End of Session Equipment Utilized During Treatment: Gait belt;Rolling walker  OT Visit Diagnosis: Unsteadiness on feet (R26.81);Other abnormalities of gait and mobility (R26.89);Muscle weakness (generalized) (M62.81);Pain Pain - part of body:  (Back)   Activity Tolerance Patient tolerated treatment well   Patient Left in chair;with call bell/phone within reach;with family/visitor present   Nurse Communication Mobility status        Time: 1610-9604 OT Time Calculation (min): 26 min  Charges:   Cammy Copa, OTS [phone]   Cammy Copa 05/02/2017, 11:49 AM

## 2017-05-02 NOTE — Progress Notes (Signed)
Pt seen and examined. No issues overnight.  EXAM: Temp:  [97.2 F (36.2 C)-98.8 F (37.1 C)] 98.7 F (37.1 C) (10/04 0839) Pulse Rate:  [66-106] 100 (10/04 0839) Resp:  [7-39] 16 (10/04 0839) BP: (108-153)/(58-105) 131/70 (10/04 0839) SpO2:  [93 %-100 %] 97 % (10/04 0839) Arterial Line BP: (112-144)/(58-76) 112/58 (10/03 1635) Intake/Output      10/03 0701 - 10/04 0700 10/04 0701 - 10/05 0700   P.O.  240   I.V. 2400    Blood 270    Other 450    Total Intake 3120 240   Urine 1750    Drains 570    Blood 800    Total Output 3120     Net 0 +240         Awake and alert Follows commands throughout Full strength  Stable Continue current care PT/OT Will order brace

## 2017-05-02 NOTE — Progress Notes (Signed)
OT Note - Addendum    05/02/17 1400  OT Visit Information  Last OT Received On 05/02/17  OT Time Calculation  OT Start Time (ACUTE ONLY) 1101  OT Stop Time (ACUTE ONLY) 1127  OT Time Calculation (min) 26 min  OT Treatments  $Self Care/Home Management  23-37 mins  [place], [place]  [phone] 05/02/2017

## 2017-05-02 NOTE — Progress Notes (Signed)
Orthopedic Tech Progress Note Patient Details:  Seth Colon 02/17/1961 [id number]  Ortho Devices Ortho Device/Splint Location: ordered Outside vendor brace [employer] quick draw) via [employer]. Ortho Device/Splint Interventions: Allyne Gee 05/02/2017, 2:47 PM

## 2017-05-02 NOTE — Anesthesia Postprocedure Evaluation (Signed)
Anesthesia Post Note  Patient: Seth Colon  Procedure(s) Performed: Lumbar Two-Sacral One Laminectomy for decompression with Lumbar two to Pelvis fixation,fusion/Mazor (N/A Back) APPLICATION OF ROBOTIC ASSISTANCE FOR SPINAL PROCEDURE (N/A )     Patient location during evaluation: PACU Anesthesia Type: General Level of consciousness: awake and alert Pain management: pain level controlled Vital Signs Assessment: post-procedure vital signs reviewed and stable Respiratory status: spontaneous breathing, nonlabored ventilation, respiratory function stable and patient connected to nasal cannula oxygen Cardiovascular status: blood pressure returned to baseline and stable Postop Assessment: no apparent nausea or vomiting Anesthetic complications: no    Last Vitals:  Vitals:   05/02/17 0839 05/02/17 1431  BP: 131/70 (!) 154/93  Pulse: 100 84  Resp: 16 16  Temp: 37.1 C 36.6 C  SpO2: 97% 100%    Last Pain:  Vitals:   05/02/17 1607  TempSrc:   PainSc: 8                  Kitrina Maurin Adriana

## 2017-05-03 MED FILL — Thrombin For Soln 5000 Unit: CUTANEOUS | Qty: 2 | Status: AC

## 2017-05-03 NOTE — Therapy (Signed)
Occupational Therapy Treatment and Discharge Patient Details Name: Seth Colon MRN: [id number] DOB: [birth year] Today's Date: 05/03/2017    History of present illness Pt is a 56 y.o. male with adult degenerative scoliosis and lumbar radiculopathy, now s/p L2-4 ALIF on 04/30/17; now s/p part 2 of sx for L2-pelvis fixation on 05/01/17. PHM including anxiety, arthritis, depression, HTN, and cervical fusion (Jan 2018).   OT comments  Pt continues to make progress towards OT goals. Focus of today's session on AE education and lumbar brace management. Pt able to return demonstration of AE for LB ADLs with supervision and required min assist to don back brace. Pt safe to d/c home when medically allowed. No further OT needs at this time.     Follow Up Recommendations  No OT follow up;Supervision/Assistance - 24 hour    Equipment Recommendations  3 in 1 bedside commode    Recommendations for Other Services      Precautions / Restrictions Precautions Precautions: Back Precaution Booklet Issued: Yes (comment) Precaution Comments: Reviewed precautions handout. Pt able to verbalize 3/3 precautions.  Required Braces or Orthoses: Spinal Brace Spinal Brace: Lumbar corset Restrictions Weight Bearing Restrictions: No       Mobility Bed Mobility               General bed mobility comments: Pt sitting in chair upon arrival with no brace on.   Transfers Overall transfer level: Needs assistance Equipment used: Rolling walker (2 wheeled) Transfers: Sit to/from Stand Sit to Stand: Min guard         General transfer comment: Min guard for safety. Good technique with RW and ability to maintain back precautions    Balance Overall balance assessment: Needs assistance Sitting-balance support: No upper extremity supported;Feet supported Sitting balance-Leahy Scale: Good     Standing balance support: Bilateral upper extremity supported;During functional activity;No upper extremity  supported Standing balance-Leahy Scale: Fair                             ADL either performed or assessed with clinical judgement   ADL Overall ADL's : Needs assistance/impaired       Grooming Details (indicate cue type and reason): Pt educated on compensatory technique for oral care with back precautions.        Lower Body Bathing Details (indicate cue type and reason): Pt educated on long handle sponge to maintain back precautions while bathing  Upper Body Dressing : Minimal assistance;Sitting Upper Body Dressing Details (indicate cue type and reason): Min assist to don lumbar brace.   Lower Body Dressing Details (indicate cue type and reason): Pt educated on reacher and sock aide to maintain back precautions while dressing.        Toileting - Clothing Manipulation Details (indicate cue type and reason): Pt educated on compensatory techniques and toilet aide to increase independence with pericare while maintaining precautions.      Functional mobility during ADLs: Min guard;Rolling walker General ADL Comments: Pt verbalized understanding of AE. Pt requires wide sock aide to don socks. Pt educated on brace wear schedule and how to don/doff brace.      Vision       Perception     Praxis      Cognition Arousal/Alertness: Awake/alert Behavior During Therapy: WFL for tasks assessed/performed Overall Cognitive Status: Within Functional Limits for tasks assessed  Exercises     Shoulder Instructions       General Comments Sister present throughout session.     Pertinent Vitals/ Pain       Pain Assessment: 0-10 Pain Score: 7  Pain Location: Back Pain Descriptors / Indicators: Sore;Operative site guarding;Grimacing Pain Intervention(s): Monitored during session;Limited activity within patient's tolerance  Home Living                                          Prior  Functioning/Environment              Frequency  Min 2X/week        Progress Toward Goals  OT Goals(current goals can now be found in the care plan section)  Progress towards OT goals: Progressing toward goals  Acute Rehab OT Goals Patient Stated Goal: Return home OT Goal Formulation: With patient/family Time For Goal Achievement: 05/14/17 Potential to Achieve Goals: Good ADL Goals Pt Will Perform Lower Body Dressing: with supervision;with set-up;with adaptive equipment;sit to/from stand Pt Will Transfer to Toilet: with supervision;with set-up;bedside commode;ambulating Pt Will Perform Toileting - Clothing Manipulation and hygiene: with supervision;with set-up;sit to/from stand Pt Will Perform Tub/Shower Transfer: Shower transfer;3 in 1;ambulating;with set-up;with supervision  Plan Discharge plan remains appropriate    Co-evaluation                 AM-PAC PT "6 Clicks" Daily Activity     Outcome Measure   Help from another person eating meals?: None Help from another person taking care of personal grooming?: None Help from another person toileting, which includes using toliet, bedpan, or urinal?: A Little Help from another person bathing (including washing, rinsing, drying)?: A Little Help from another person to put on and taking off regular upper body clothing?: None Help from another person to put on and taking off regular lower body clothing?: A Little 6 Click Score: 21    End of Session Equipment Utilized During Treatment: Gait belt;Rolling walker  OT Visit Diagnosis: Unsteadiness on feet (R26.81);Other abnormalities of gait and mobility (R26.89);Muscle weakness (generalized) (M62.81);Pain Pain - part of body:  (Back)   Activity Tolerance Patient tolerated treatment well   Patient Left in chair;with call bell/phone within reach;with family/visitor present   Nurse Communication Mobility status        Time: 1191-4782 OT Time Calculation (min): 18  min  Charges: OT General Charges $OT Visit: 1 Visit OT Treatments $Self Care/Home Management : 8-22 mins   Cammy Copa, [place] #[phone]   Cammy Copa 05/03/2017, 1:28 PM

## 2017-05-03 NOTE — Progress Notes (Signed)
Pt seen and examined. No issues overnight.  EXAM: Temp:  [97.8 F (36.6 C)-98.6 F (37 C)] 98.6 F (37 C) (10/04 2144) Pulse Rate:  [68-84] 68 (10/04 2300) Resp:  [16] 16 (10/04 1431) BP: (92-154)/(57-93) 92/65 (10/04 2300) SpO2:  [95 %-100 %] 95 % (10/04 2300) Intake/Output      10/04 0701 - 10/05 0700 10/05 0701 - 10/06 0700   P.O. 720 240   Total Intake 720 240   Urine 925    Total Output 925     Net -205 +240        Urine Occurrence  1 x    Awake and alert Follows commands throughout Full strength  Stable D/c drain Likely hope tomorrow

## 2017-05-03 NOTE — Progress Notes (Signed)
Physical Therapy Treatment Patient Details Name: Seth Colon MRN: [id number] DOB: 10/01/1960 Today's Date: 05/03/2017    History of Present Illness Pt is a 56 y.o. male with adult degenerative scoliosis and lumbar radiculopathy, now s/p L2-4 ALIF on 04/30/17; now s/p part 2 of sx for L2-pelvis fixation on 05/01/17. PHM including anxiety, arthritis, depression, HTN, and cervical fusion (Jan 2018).    PT Comments    Patient is making good progress with PT.  From a mobility standpoint anticipate patient will be ready for DC home when medically ready.      Follow Up Recommendations  No PT follow up;Supervision for mobility/OOB     Equipment Recommendations  Rolling walker with 5" wheels    Recommendations for Other Services       Precautions / Restrictions Precautions Precautions: Back Precaution Booklet Issued: Yes (comment) Precaution Comments: pt able to recall 3/3 precautions  Required Braces or Orthoses: Spinal Brace Spinal Brace: Lumbar corset Other Brace/Splint: applied in sitting Restrictions Weight Bearing Restrictions: No    Mobility  Bed Mobility Overal bed mobility: Needs Assistance Bed Mobility: Rolling;Sidelying to Sit;Sit to Sidelying Rolling: Min guard Sidelying to sit: Supervision     Sit to sidelying: Min assist General bed mobility comments: assist to bring bilat LE into bed; cues for sequencing when returning to supine; pt did demonstrate carry over of technique  Transfers Overall transfer level: Needs assistance Equipment used: Rolling walker (2 wheeled) Transfers: Sit to/from Stand Sit to Stand: Min guard         General transfer comment: min guard for safety; pt reported dizziness upon standing that resolved quickly  Ambulation/Gait Ambulation/Gait assistance: Supervision Ambulation Distance (Feet): 300 Feet Assistive device: Rolling walker (2 wheeled) Gait Pattern/deviations: Step-through pattern;Decreased stride length Gait velocity:  Decreased   General Gait Details: slow, steady gait; cues for increased bilat step lenght   Stairs     Stair Management: One rail Right;Step to pattern;Sideways Number of Stairs: 2 General stair comments: carry over of safe technique; supervision for safety  Wheelchair Mobility    Modified Rankin (Stroke Patients Only)       Balance Overall balance assessment: Needs assistance Sitting-balance support: No upper extremity supported;Feet supported Sitting balance-Leahy Scale: Good     Standing balance support: Bilateral upper extremity supported;During functional activity Standing balance-Leahy Scale: Fair                              Cognition Arousal/Alertness: Awake/alert Behavior During Therapy: WFL for tasks assessed/performed Overall Cognitive Status: Within Functional Limits for tasks assessed                                        Exercises      General Comments General comments (skin integrity, edema, etc.): Sister present throughout session.       Pertinent Vitals/Pain Pain Assessment: Faces Pain Score: 7  Faces Pain Scale: Hurts little more Pain Location: Back Pain Descriptors / Indicators: Sore;Guarding Pain Intervention(s): Monitored during session;Repositioned    Home Living                      Prior Function            PT Goals (current goals can now be found in the care plan section) Acute Rehab PT Goals Patient Stated Goal: Return home PT  Goal Formulation: With patient Time For Goal Achievement: 04/30/17 Potential to Achieve Goals: Good Progress towards PT goals: Progressing toward goals    Frequency    Min 5X/week      PT Plan Current plan remains appropriate    Co-evaluation              AM-PAC PT "6 Clicks" Daily Activity  Outcome Measure  Difficulty turning over in bed (including adjusting bedclothes, sheets and blankets)?: A Little Difficulty moving from lying on back to  sitting on the side of the bed? : A Little Difficulty sitting down on and standing up from a chair with arms (e.g., wheelchair, bedside commode, etc,.)?: A Little Help needed moving to and from a bed to chair (including a wheelchair)?: A Little Help needed walking in hospital room?: None Help needed climbing 3-5 steps with a railing? : A Little 6 Click Score: 19    End of Session Equipment Utilized During Treatment: Gait belt Activity Tolerance: Patient tolerated treatment well Patient left: with call bell/phone within reach;in bed;with family/visitor present Nurse Communication: Mobility status PT Visit Diagnosis: Unsteadiness on feet (R26.81);Other abnormalities of gait and mobility (R26.89)     Time: 1610-9604 PT Time Calculation (min) (ACUTE ONLY): 23 min  Charges:  $Gait Training: 8-22 mins $Therapeutic Activity: 8-22 mins                    G Codes:       Erline Levine, PTA Pager: [phone]     Carolynne Edouard 05/03/2017, 2:34 PM

## 2017-05-04 MED ORDER — GABAPENTIN 300 MG PO CAPS: 300.0000 mg | ORAL_CAPSULE | Freq: Three times a day (TID) | ORAL | 2 refills | Status: AC

## 2017-05-04 MED ORDER — OXYCODONE-ACETAMINOPHEN 7.5-325 MG PO TABS: 1.0000 | ORAL_TABLET | ORAL | 0 refills | Status: AC | PRN

## 2017-05-04 MED ORDER — METHOCARBAMOL 750 MG PO TABS: 750.0000 mg | ORAL_TABLET | Freq: Three times a day (TID) | ORAL | 1 refills | Status: AC | PRN

## 2017-05-04 NOTE — Discharge Summary (Signed)
Physician Discharge Summary  Patient ID: Seth Colon MRN: [id number] DOB/AGE: 1960/08/27 56 y.o.  Admit date: 04/30/2017 Discharge date: 05/04/2017  Admission Diagnoses:  Lumbar radiculopathy  Discharge Diagnoses:  Same Active Problems:   Lumbar radiculopathy   Discharged Condition: Stable  Hospital Course:  Seth Colon is a 56 y.o. male who was admitted for the below procedure. There were no post operative complications. Does have some hip weakness b/l not unexpected based on lateral approach. This should improve with time. At time of discharge, pain was well controlled, ambulating with Pt/OT, tolerating po, voiding normal. Ready for discharge.  Treatments: Surgery - two stage Stage 1: PROCEDURE:  L2-5 anterior lumbar interbody fusion via transpsoas approach; use of BMP  Stage 2: PROCEDURE:  Procedure(s): Lumbar Two-Sacral One Laminectomy for decompression with Lumbar two to Pelvis fixation,fusion/Mazor (N/A) APPLICATION OF ROBOTIC ASSISTANCE FOR SPINAL PROCEDURE (N/A)   Discharge Exam: Blood pressure 129/77, pulse 76, temperature 97.7 F (36.5 C), temperature source Oral, resp. rate 16, SpO2 98 %. Awake, alert, oriented Speech fluent, appropriate CN grossly intact MAEW with normal strength with exception of 4/5 hip flexion b/l  Wound c/d/i  Disposition: Final discharge disposition not confirmed  Discharge Instructions    Call MD for:  difficulty breathing, headache or visual disturbances    Complete by:  As directed    Call MD for:  persistant dizziness or light-headedness    Complete by:  As directed    Call MD for:  redness, tenderness, or signs of infection (pain, swelling, redness, odor or green/yellow discharge around incision site)    Complete by:  As directed    Call MD for:  severe uncontrolled pain    Complete by:  As directed    Call MD for:  temperature >100.4    Complete by:  As directed    Diet general    Complete by:  As directed    Driving  Restrictions    Complete by:  As directed    Do not drive until given clearance.   Increase activity slowly    Complete by:  As directed    Lifting restrictions    Complete by:  As directed    Do not lift anything >10lbs. Avoid bending and twisting in awkward positions. Avoid bending at the back.   May shower / Bathe    Complete by:  As directed    In 24 hours. Okay to wash wound with warm soapy water. Avoid scrubbing the wound. Pat dry.   Remove dressing in 24 hours    Complete by:  As directed      Allergies as of 05/04/2017      Reactions   Other Swelling   MELONS > FACIAL SWELLING      Medication List    TAKE these medications   amLODipine 10 MG tablet Commonly known as:  NORVASC Take 10 mg by mouth daily.   gabapentin 300 MG capsule Commonly known as:  NEURONTIN Take 1 capsule (300 mg total) by mouth 3 (three) times daily.   lisinopril 40 MG tablet Commonly known as:  PRINIVIL,ZESTRIL Take 40 mg by mouth daily.   methocarbamol 750 MG tablet Commonly known as:  ROBAXIN Take 750 mg by mouth 2 (two) times daily. What changed:  Another medication with the same name was added. Make sure you understand how and when to take each.   methocarbamol 750 MG tablet Commonly known as:  ROBAXIN-750 Take 1 tablet (750 mg total) by mouth 3 (three)  times daily as needed for muscle spasms. What changed:  You were already taking a medication with the same name, and this prescription was added. Make sure you understand how and when to take each.   oxyCODONE-acetaminophen 7.5-325 MG tablet Commonly known as:  PERCOCET Take 1 tablet by mouth every 4 (four) hours as needed for severe pain.   propranolol 20 MG tablet Commonly known as:  INDERAL Take 20 mg by mouth daily.            Durable Medical Equipment        Start     Ordered   05/04/17 0750  DME 3-in-1  Once     05/04/17 0749   05/04/17 0750  For home use only DME Walker rolling  [facility])  Once    Question:   Patient needs a walker to treat with the following condition  Answer:  Gait instability   05/04/17 0749   05/03/17 1429  For home use only DME Walker rolling  Once    Question:  Patient needs a walker to treat with the following condition  Answer:  S/P lumbar fusion   05/03/17 1428   05/03/17 1429  For home use only DME 3 n 1  Once     05/03/17 1428     Follow-up Information    Ditty, Loura Halt, MD. Schedule an appointment as soon as possible for a visit in 3 week(s).   Specialty:  Neurosurgery Contact information: [address] [place] [address] [phone]           Signed: Alyson Ingles 05/04/2017, 7:50 AM

## 2017-05-04 NOTE — Progress Notes (Signed)
Physical Therapy Treatment Patient Details Name: Seth Colon MRN: [id number] DOB: [birth year] Today's Date: 05/04/2017    History of Present Illness Pt is a 56 y.o. male with adult degenerative scoliosis and lumbar radiculopathy, now s/p L2-4 ALIF on 04/30/17; now s/p part 2 of sx for L2-pelvis fixation on 05/01/17. PHM including anxiety, arthritis, depression, HTN, and cervical fusion (Jan 2018).    PT Comments    Patient up and moving within room upon PT arrival, with good motivation to work with therapy. Patient with back brace donned with good comfort. Patient ambulating well with good step through gait pattern, however slow. Patient reporting great improvement in BLE sensation. Able to recall 3/3 back precautions and good understanding to follow precautions in the home setting. Will plan to continue to follow patient acutely to maximize functional mobility prior to d/c home.    Follow Up Recommendations  No PT follow up;Supervision for mobility/OOB     Equipment Recommendations  Rolling walker with 5" wheels    Recommendations for Other Services       Precautions / Restrictions Precautions Precautions: Back Precaution Comments: pt able to recall 3/3 precautions  Required Braces or Orthoses: Spinal Brace Spinal Brace: Lumbar corset (patient wearing brace upon PT arrival) Restrictions Weight Bearing Restrictions: No    Mobility  Bed Mobility                  Transfers Overall transfer level: Needs assistance Equipment used: Rolling walker (2 wheeled) Transfers: Sit to/from Stand Sit to Stand: Supervision         General transfer comment: supervision for safety. Patient continuing to report dizziness upon first standing, no LOB  Ambulation/Gait Ambulation/Gait assistance: Supervision Ambulation Distance (Feet): 500 Feet Assistive device: Rolling walker (2 wheeled) Gait Pattern/deviations: Step-through pattern;Decreased stride length     General Gait  Details: slow gait, patient reporting improved sensation through B LE   Stairs            Wheelchair Mobility    Modified Rankin (Stroke Patients Only)       Balance           Standing balance support: Bilateral upper extremity supported;During functional activity Standing balance-Leahy Scale: Fair                              Cognition Arousal/Alertness: Awake/alert Behavior During Therapy: WFL for tasks assessed/performed Overall Cognitive Status: Within Functional Limits for tasks assessed                                        Exercises      General Comments        Pertinent Vitals/Pain Pain Assessment: 0-10 Pain Score: 4  Pain Location: Back Pain Descriptors / Indicators: Aching;Sore Pain Intervention(s): Limited activity within patient's tolerance;Monitored during session    Home Living                      Prior Function            PT Goals (current goals can now be found in the care plan section) Acute Rehab PT Goals Patient Stated Goal: Return home Progress towards PT goals: Progressing toward goals    Frequency    Min 5X/week      PT Plan Current plan remains appropriate    Co-evaluation  AM-PAC PT "6 Clicks" Daily Activity  Outcome Measure  Difficulty turning over in bed (including adjusting bedclothes, sheets and blankets)?: A Little Difficulty moving from lying on back to sitting on the side of the bed? : A Little Difficulty sitting down on and standing up from a chair with arms (e.g., wheelchair, bedside commode, etc,.)?: A Little Help needed moving to and from a bed to chair (including a wheelchair)?: A Little Help needed walking in hospital room?: None Help needed climbing 3-5 steps with a railing? : A Little 6 Click Score: 19    End of Session Equipment Utilized During Treatment: Back brace Activity Tolerance: Patient tolerated treatment well Patient left: in  chair;with call bell/phone within reach Nurse Communication: Mobility status PT Visit Diagnosis: Unsteadiness on feet (R26.81);Other abnormalities of gait and mobility (R26.89)     Time: 2130-8657 PT Time Calculation (min) (ACUTE ONLY): 14 min  Charges:  $Gait Training: 8-22 mins                     Kipp Laurence, PT, DPT 05/04/17 11:13 AM

## 2017-05-04 NOTE — Progress Notes (Signed)
Pt ready to be discharged from unit to home. Family to provided transportation. All d/c instructions and rx reviewed with pt. Hemovac drain removed from posterior back incision. All d/c instructions and rx reviewed. Pt demonstrates no distress.

## 2017-05-04 NOTE — Op Note (Signed)
04/30/2017 - 05/01/2017  12:28 PM  PATIENT:  Seth Colon  56 y.o. male  PRE-OPERATIVE DIAGNOSIS:  Lumbosacral spondylosis with radiculopathy; degenerative scoliosis in the adult patient  POST-OPERATIVE DIAGNOSIS:  Same  PROCEDURE:  Stage Two: L2-S1 laminectomy for decompression; L5-S1 posterior lumbar interbody fusion; L2-S1 posterior segmental instrumented fusion; pelvic fixation utilizing S2 alar iliac screws; use of BMP  SURGEON:  Hulan Saas, MD  ASSISTANTS: Tressie Stalker, MD  ANESTHESIA:   General  DRAINS: Medium hemovac drain`   SPECIMEN:  None  INDICATION FOR PROCEDURE: Moritz Lever returns to the operating room in a planned fashion for the second part of a staged procedure.  He underwent L2-5 anterior interbody fusion yesterday.  We have previously discussed the risks, benefits, and alternatives to surgery and he wishes to proceed.  PROCEDURE DETAILS: After smooth induction of general endotracheal anesthesia the patient was turned prone on the [place] table. The skin of the lumbar area was clipped of hair and wiped out with alcohol. It was prepped and draped in the usual sterile fashion. The planned incision was injected with a mixture of lidocaine and Marcaine with epinephrine.  The skin was opened sharply and a subperiosteal dissection was performed to expose the lateral edges of the lamina of L2-S1 as well as the entry points for S2AI screws.  Subperiosteal dissection was performed over the L2-3, L3-4, L4-5 and L5-S1 facet joints bilaterally.    The [facility] robotic system was registered to the patient using fluoroscopy.  Using the [facility] as a drill guide traditional trajectory pedicle screw tracts were then drilled at L2-S1 bilaterally.  S2AI tracts were also drilled utilizing the robot as a drill guide.  K-Wires were placed down the trajectories and I then tapped over the K-wires.  K-wires were removed and the trajectories were palpated with a ball tip probe and  found to be competent.  Screws were then placed at all levels except for S1, to facilitate the posterior lumbar interbody fusion  The spinous processes from L2-S1 were removed with rongeurs. The laminae were then thinned with a high-speed bur. The laminae were resected from L2-S1 with a Kerrison punch. Thickened ligamentum flavum was separated from the dura and resected with a Kerrison rongeur. The thecal sac was further freed from the hypertrophied ligament in the lateral recesses.  Lateral recess decompression was completed. Decompression was carried out laterally to the foramina. The foramina were palpated and found to be without residual stenosis.   I used the high speed drill to drill a trough across the pars interarticularis at L5.  I then used an osteotome to fracture the bone across the pars and separate the inferior articular process at each level.  This was separated from the underlying dura and ligament and removed from the surgical field.  The underlying ligament was elevated and resected.  Decompression was carried out to the lateral recesses with Kerrison rongeurs.  The superior edge of the exposed superior articular processes was resected to expose underlying annulus.  The thecal sac and nerve roots was separated from the posterior vertebral body wall and annulus where interbody fusion was planned.  At L5-S1 an annulotomy was made. Using sequentially larger disc shavers this space was expanded. Disc material was removed with shavers, curettes, and the bone was decorticated with a rasp. The interbody space was packed with a combination of BMP and locally harvested bone and two titanium spacers were inserted into the interbody space.   Screws were then placed to the appropriate  depth down the cannulated tracts at S1.  A prebent lordotic rod was inserted and secured in the screw caps. Final tightening with a torque wrench was performed at all levels. The remaining facet joints and transverse  processes from L2-L5 and the sacral ala were decorticated with the high speed burr.  Fusion substrate including BMP was placed in the lateral gutters.  Meticulous hemostasis was obtained. The wound was irrigated with bacitracin saline. Exparel was injected into the paraspinous muscles.  A medium Hemovac drain was placed below the fascia. About 1 g of vancomycin powder was inserted into the wound. The wound was closed in routine anatomic layers using running Stratafix suture. The skin was closed with a running subcuticular monocryl suture and then sealed with dermabond.   The patient was returned to the supine position and awoke without complication.  PATIENT DISPOSITION:  PACU - hemodynamically stable.   Delay start of Pharmacological VTE agent (>24hrs) due to surgical blood loss or risk of bleeding:  yes

## 2017-05-06 ENCOUNTER — Encounter (HOSPITAL_COMMUNITY): Payer: Self-pay | Admitting: Neurological Surgery

## 2018-04-02 IMAGING — RF DG LUMBAR SPINE 2-3V
1 series · 3 of 3 positions shown · non-contrast
Comparison: Lumbar spine CT 03/14/2017.

CLINICAL DATA: Left side approach L2-3 XLIF. Right side approach
L3-4 and L4-5 XLIF.

EXAM:
DG C-ARM 61-120 MIN; LUMBAR SPINE - 2-3 VIEW

[Series 1: run · 3 of 3 slices shown]
[im 1/3]
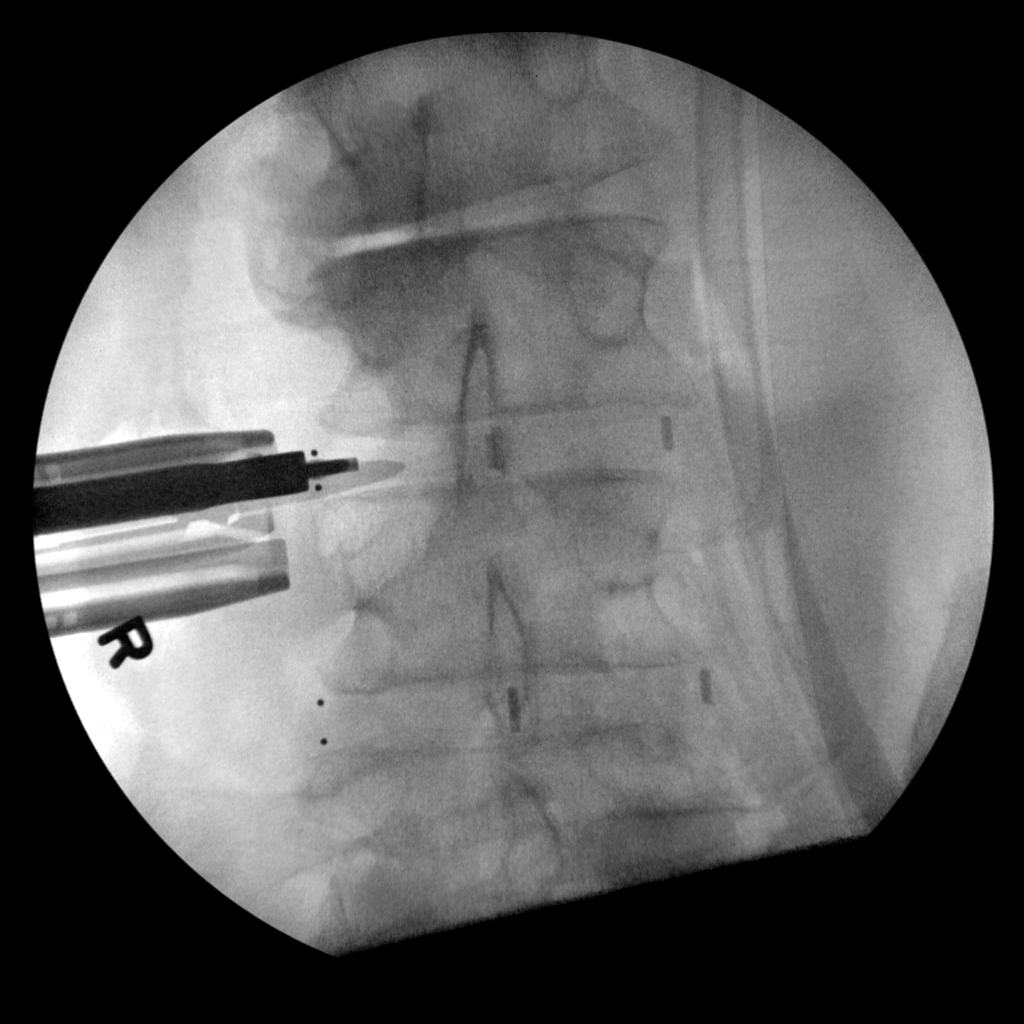
[im 2/3]
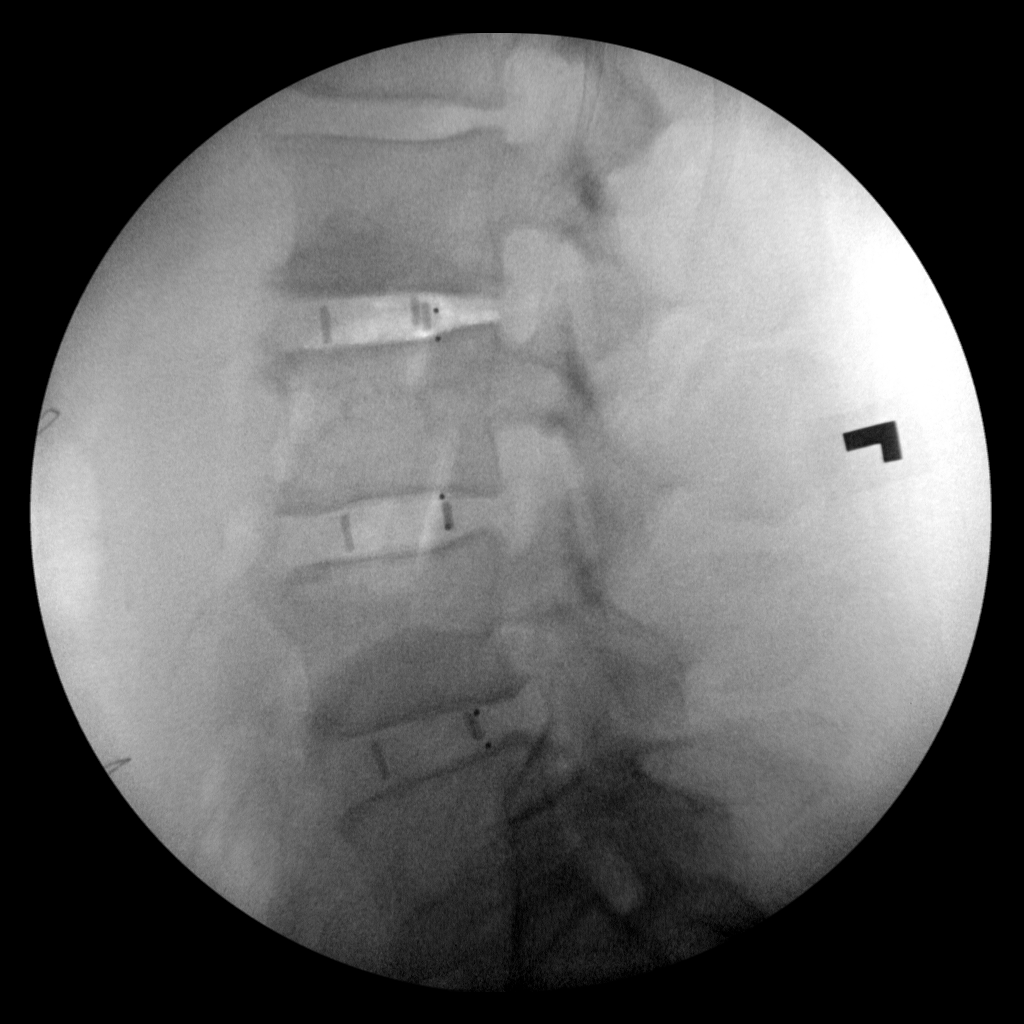
[im 3/3]
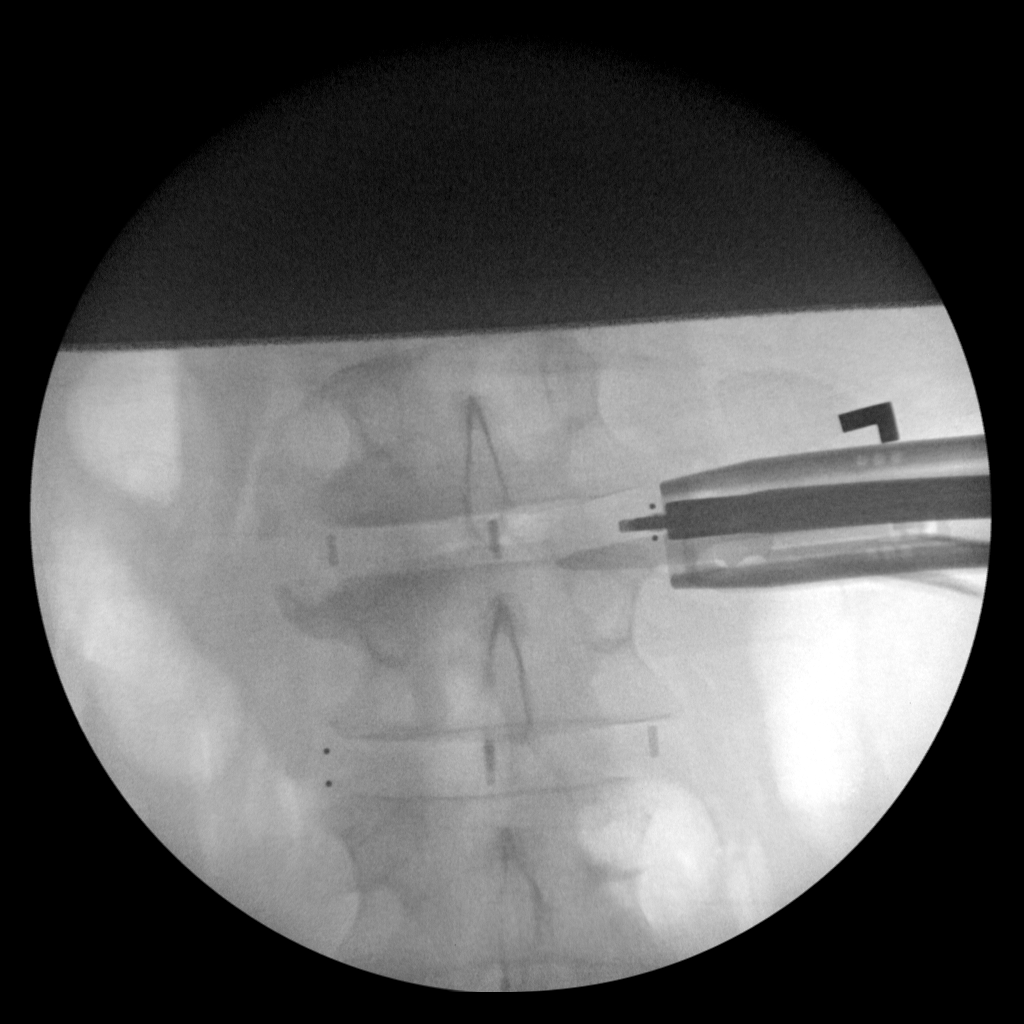

[3 of 3 positions shown; findings below may reference images not displayed]

FINDINGS: Three intraoperative spot fluoro images are submitted. Field-of-view
is too small to allow localization. Final film demonstrates 3 level
discectomy with interbody graft markers evident.
IMPRESSION: Intraoperative localization during lumbar spine surgery.

## 2018-04-02 IMAGING — CT CT PELVIS W/O CM
3 series · 14 of 33 positions shown, 17 images · non-contrast
Comparison: None.

CLINICAL DATA: Preop for additional lumbosacral surgery. Patient
had lumbar surgery today.

EXAM:
CT PELVIS WITHOUT CONTRAST
TECHNIQUE: Multidetector CT imaging of the pelvis was performed following the
standard protocol without intravenous contrast.

[Series 5: l spine 2.0 i30s 3 · axial · 0.59mm/px · z∈[+912,+1276]mm · 6 of 238 slices shown, 8 images]
[im 37/238  soft-tissue]
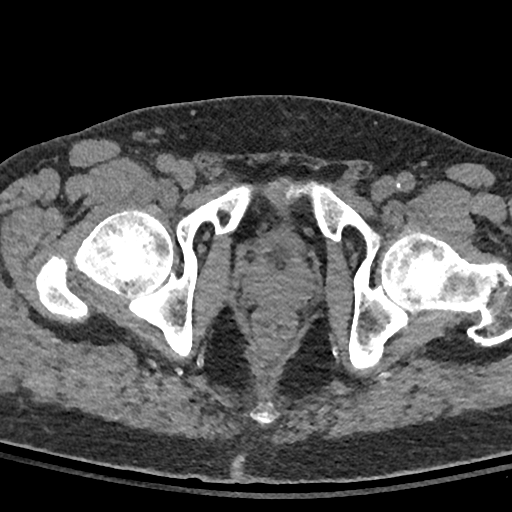
[im 37/238  bone]
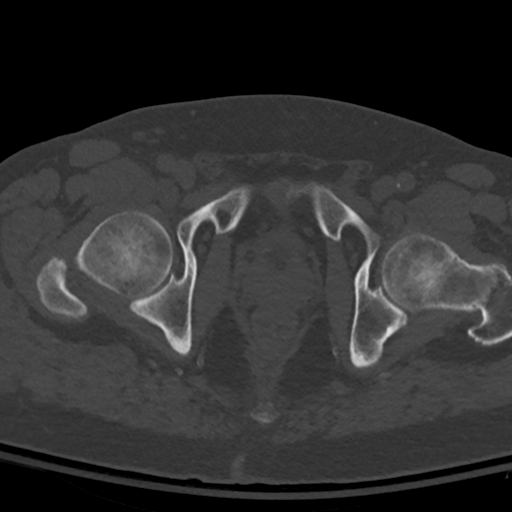
[im 73/238  bone]
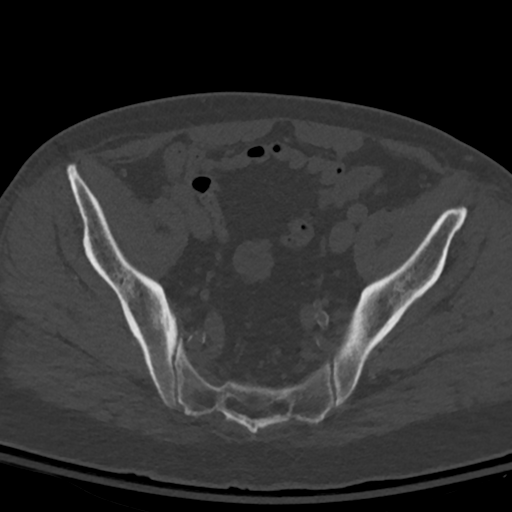
[im 110/238  bone]
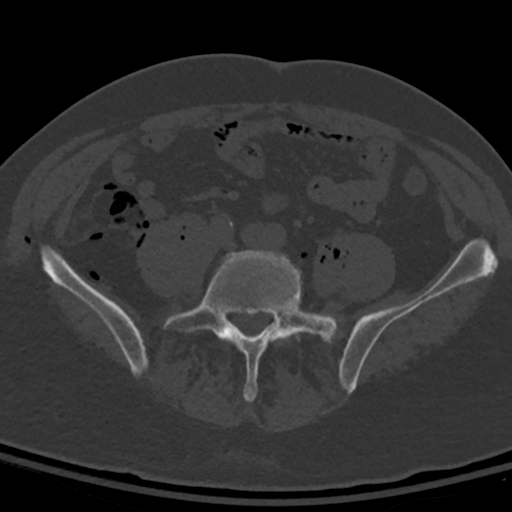
[im 146/238  bone]
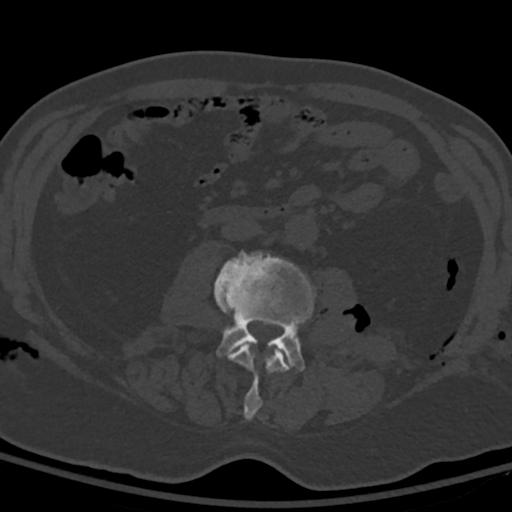
[im 183/238  soft-tissue]
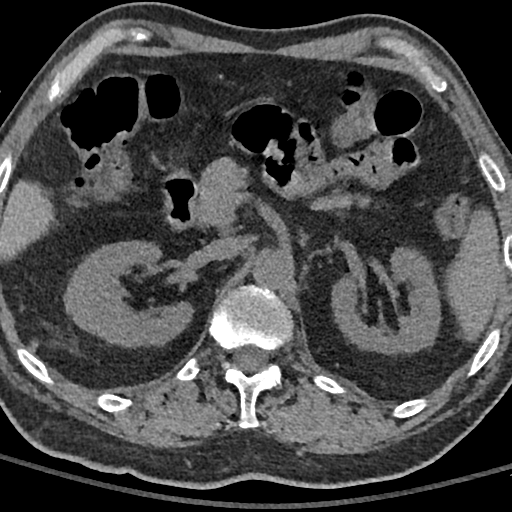
[im 183/238  bone]
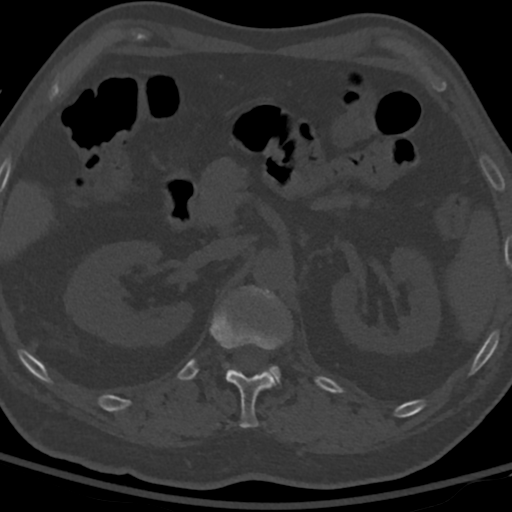
[im 219/238  bone]
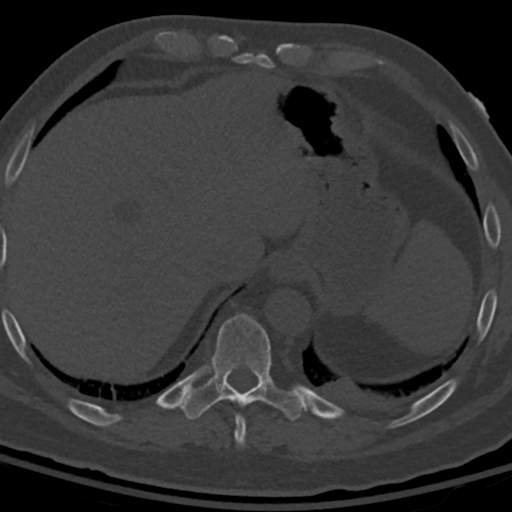

[Series 6: l spine 2.0 mpr st cor · coronal · 0.93mm/px · 3 of 134 slices shown]
[im 27/134  bone]
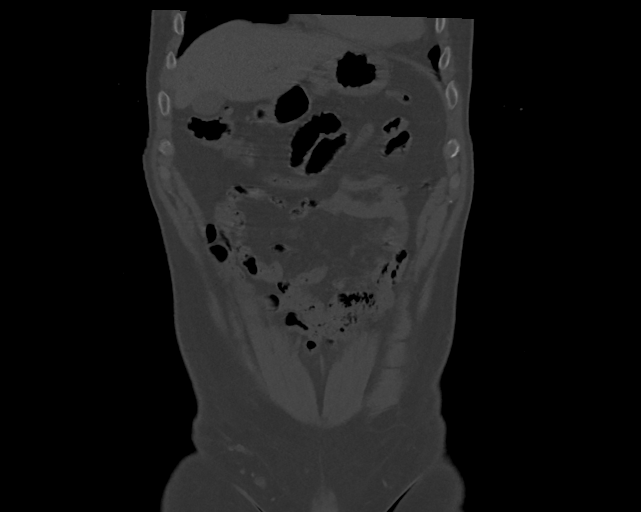
[im 54/134  bone]
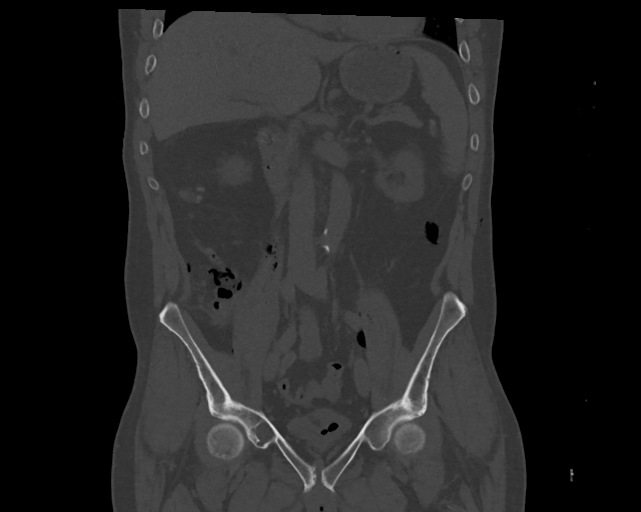
[im 80/134  bone]
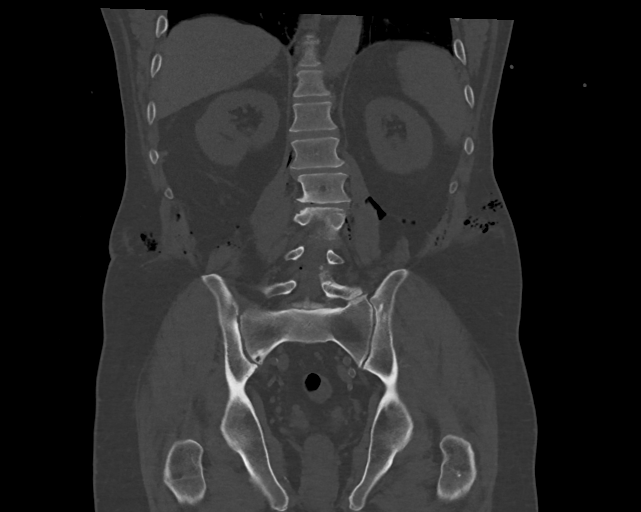

[Series 7: l spine 2.0 mpr st sag · sagittal · 0.82mm/px · 5 of 170 slices shown, 6 images]
[im 57/170  bone]
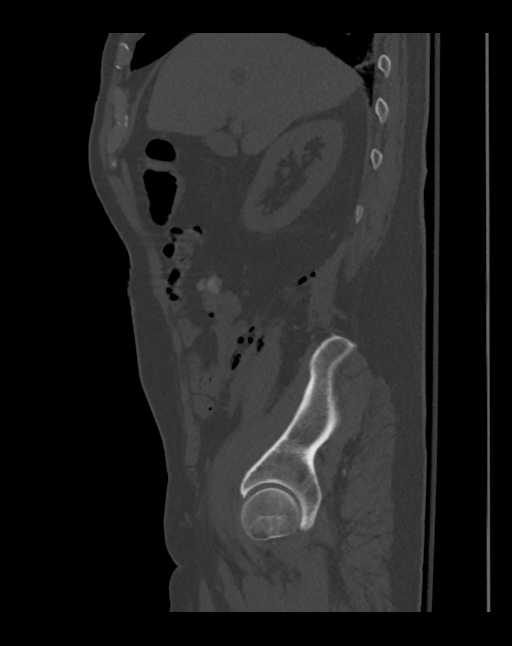
[im 71/170  bone]
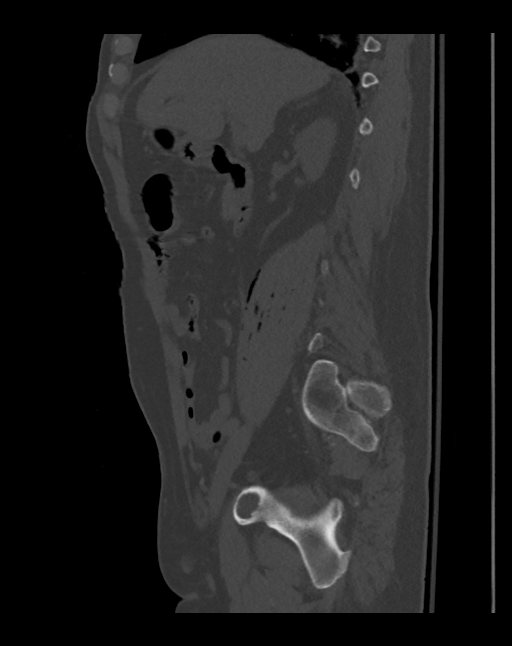
[im 85/170  soft-tissue]
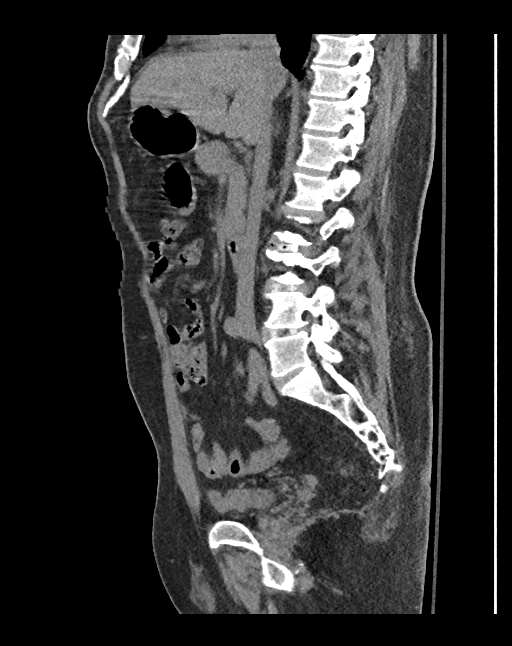
[im 85/170  bone]
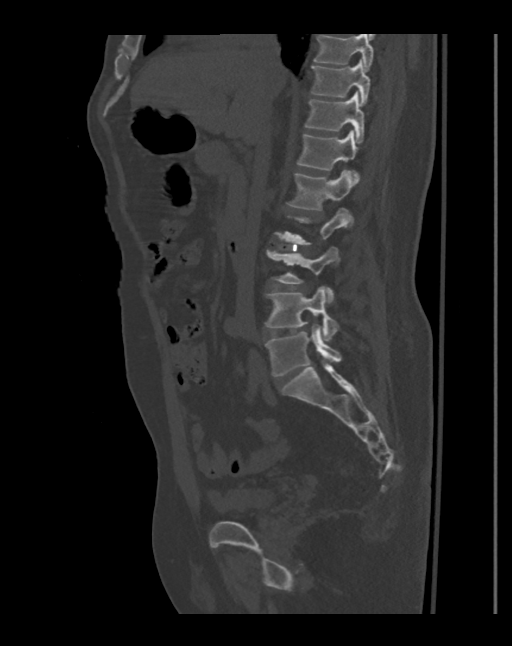
[im 99/170  bone]
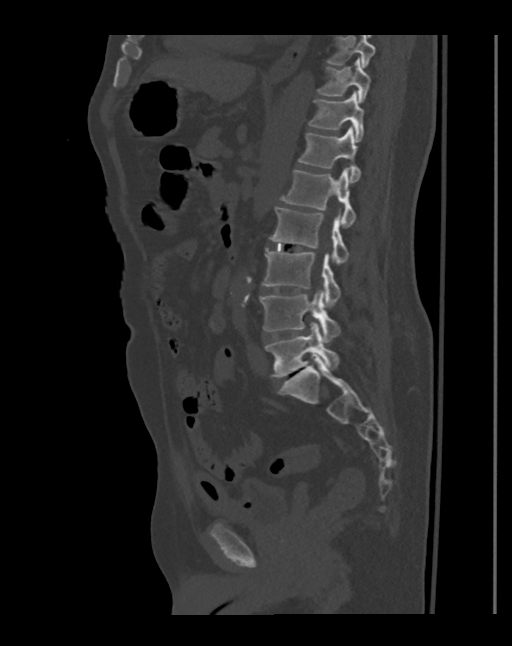
[im 113/170  bone]
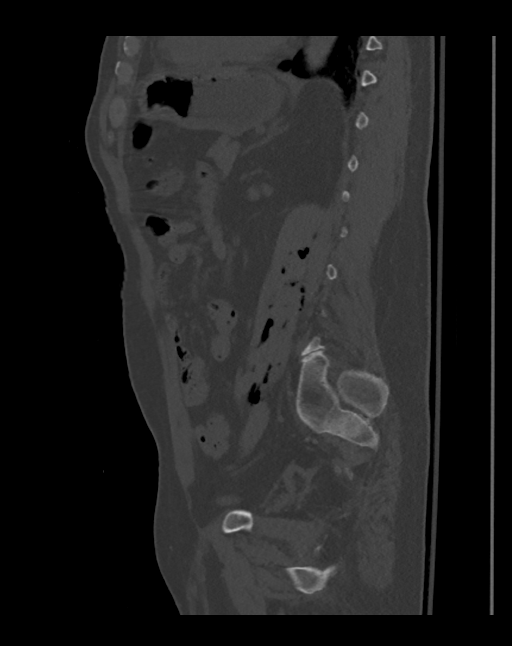

[14 of 33 positions shown; findings below may reference images not displayed]

FINDINGS: Urinary Tract: The bladder contains a Foley catheter and is
decompressed.

Bowel: The small bowel and colon are grossly normal. Diffuse colonic
diverticulosis is noted.

Vascular/Lymphatic: Vascular calcifications.  No lymphadenopathy.

Reproductive: The prostate gland and seminal vesicles are
unremarkable.

Other: Free air noted throughout the abdomen and pelvis and in the
abdominal walls in the flank areas bilaterally consistent with
recent surgery. Air noted in the left iliac crest possibly from a
bone harvest site.

Small amount of free fluid noted in the right pericolic gutter,
likely related to surgery and likely a small amount of hematoma. No
free pelvic fluid.

Musculoskeletal: Expected postoperative changes with interbody
fusion devices at L2-3, L3-4 and L4-5. Some expected air in the disc
spaces and around the spine. No complicating features such as
fracture or retropulsion. Moderate degenerative disc disease at
L5-S1.

The hips are normally located. Mild degenerative changes. Pubic
symphysis and SI joints are intact. Simple appearing bone cysts
noted in the right pubic bone. The sacrum and iliac bones appear
intact.
IMPRESSION: Expected postoperative changes with interbody fusion devices L2-3,
L3-4 and L4-5.

Free air in the abdomen and pelvis likely related to today's
surgery.

## 2023-08-31 DEATH — deceased
